# Patient Record
Sex: Male | Born: 1980 | Race: Black or African American | Hispanic: No | Marital: Married | Smoking: Former smoker
Health system: Southern US, Community
[De-identification: ages and names within clinical notes are randomized; demographics above are authoritative.]

## PROBLEM LIST (undated history)

## (undated) DIAGNOSIS — J45909 Unspecified asthma, uncomplicated: Secondary | ICD-10-CM

## (undated) DIAGNOSIS — G4733 Obstructive sleep apnea (adult) (pediatric): Secondary | ICD-10-CM

## (undated) DIAGNOSIS — I10 Essential (primary) hypertension: Secondary | ICD-10-CM

## (undated) DIAGNOSIS — R011 Cardiac murmur, unspecified: Secondary | ICD-10-CM

## (undated) DIAGNOSIS — E785 Hyperlipidemia, unspecified: Secondary | ICD-10-CM

## (undated) DIAGNOSIS — K219 Gastro-esophageal reflux disease without esophagitis: Secondary | ICD-10-CM

## (undated) DIAGNOSIS — Z9989 Dependence on other enabling machines and devices: Secondary | ICD-10-CM

## (undated) DIAGNOSIS — M1990 Unspecified osteoarthritis, unspecified site: Secondary | ICD-10-CM

## (undated) HISTORY — DX: Cardiac murmur, unspecified: R01.1

## (undated) HISTORY — DX: Unspecified osteoarthritis, unspecified site: M19.90

## (undated) HISTORY — PX: ANTERIOR CRUCIATE LIGAMENT REPAIR: SHX115

## (undated) HISTORY — DX: Gastro-esophageal reflux disease without esophagitis: K21.9

## (undated) HISTORY — DX: Hyperlipidemia, unspecified: E78.5

---

## 2006-04-10 ENCOUNTER — Encounter: Admission: RE | Admit: 2006-04-10 | Discharge: 2006-04-10 | Payer: Self-pay | Admitting: Orthopedic Surgery

## 2012-04-12 ENCOUNTER — Encounter (HOSPITAL_BASED_OUTPATIENT_CLINIC_OR_DEPARTMENT_OTHER): Payer: Self-pay | Admitting: *Deleted

## 2012-04-12 ENCOUNTER — Emergency Department (HOSPITAL_BASED_OUTPATIENT_CLINIC_OR_DEPARTMENT_OTHER)
Admission: EM | Admit: 2012-04-12 | Discharge: 2012-04-12 | Disposition: A | Discharge status: Home / Self Care | Payer: Self-pay | Attending: Emergency Medicine | Admitting: Emergency Medicine

## 2012-04-12 DIAGNOSIS — F17200 Nicotine dependence, unspecified, uncomplicated: Secondary | ICD-10-CM | POA: Insufficient documentation

## 2012-04-12 DIAGNOSIS — L02415 Cutaneous abscess of right lower limb: Secondary | ICD-10-CM

## 2012-04-12 DIAGNOSIS — R234 Changes in skin texture: Secondary | ICD-10-CM | POA: Insufficient documentation

## 2012-04-12 DIAGNOSIS — J45909 Unspecified asthma, uncomplicated: Secondary | ICD-10-CM | POA: Insufficient documentation

## 2012-04-12 HISTORY — DX: Unspecified asthma, uncomplicated: J45.909

## 2012-04-12 MED ORDER — DOXYCYCLINE HYCLATE 100 MG PO CAPS: 100.0000 mg | ORAL_CAPSULE | Freq: Two times a day (BID) | ORAL | Status: AC

## 2012-04-12 NOTE — Discharge Instructions (Signed)

## 2012-04-12 NOTE — ED Notes (Signed)
Pt c/o "bite mark" to right hip area. Swelling/redness with scabbed inner area. Pt denies fevers.

## 2012-04-12 NOTE — ED Provider Notes (Signed)
History     CSN: [id number]  Arrival date & time 04/12/12  2230   First MD Initiated Contact with Patient 04/12/12 2259      Chief Complaint  Patient presents with  . Insect Bite    (Consider location/radiation/quality/duration/timing/severity/associated sxs/prior treatment) Patient is a 31 y.o. male presenting with rash. The history is provided by the patient. No language interpreter was used.  Rash  This is a new problem. The current episode started yesterday. The problem has been gradually worsening. The problem is associated with nothing. The rash is present on the right lower leg. The pain is at a severity of 3/10. The pain is mild. The pain has been constant since onset. Associated symptoms include blisters, itching and pain. He has tried nothing for the symptoms. The treatment provided no relief.  Pt complains of pain to right thigh. Pt thinks he may have been bitten by something  Past Medical History  Diagnosis Date  . Asthma     Past Surgical History  Procedure Date  . Anterior cruciate ligament repair     No family history on file.  History  Substance Use Topics  . Smoking status: Current Everyday Smoker  . Smokeless tobacco: Not on file  . Alcohol Use: Yes      Review of Systems  Skin: Positive for itching and rash.  All other systems reviewed and are negative.    Allergies  Peanuts  Home Medications   Current Outpatient Rx  Name Route Sig Dispense Refill  . ALBUTEROL SULFATE HFA 108 (90 BASE) MCG/ACT IN AERS Inhalation Inhale 2 puffs into the lungs every 6 (six) hours as needed. Patient used this medication for wheezing and shortness of breath.    . EXCEDRIN PO Oral Take 2 tablets by mouth daily as needed. Patient used this medication for his headache.    Marland Kitchen HYDROCORTISONE 1 % EX CREA Topical Apply 1 application topically 2 (two) times daily. Patient used this medication for a bug bite.      BP 149/94  Pulse 82  Temp 98.5 F (36.9 C) (Oral)   Resp 16  Ht 5\' 11"  (1.803 m)  Wt 290 lb (131.543 kg)  BMI 40.45 kg/m2  SpO2 100%  Physical Exam  Vitals reviewed. Constitutional: He is oriented to person, place, and time. He appears well-developed and well-nourished.  Abdominal: Soft.  Musculoskeletal: Normal range of motion.  Neurological: He is alert and oriented to person, place, and time. He has normal reflexes.  Skin: There is erythema.       Swollen right thigh,  Scab center,  Drainage from area  Psychiatric: He has a normal mood and affect.    ED Course  Procedures (including critical care time)  Labs Reviewed - No data to display No results found.   No diagnosis found.    MDM  I unroofed scab and pt had purulent drainage,  Culture obtained.   Pt given rx for doxycycline        Lonia Skinner [place], [place] 04/12/12 2340

## 2012-04-13 NOTE — ED Provider Notes (Signed)
Medical screening examination/treatment/procedure(s) were performed by non-physician practitioner and as supervising physician I was immediately available for consultation/collaboration.  Jasmine Awe, MD 04/13/12 [phone]

## 2012-04-15 LAB — WOUND CULTURE: Special Requests: NORMAL

## 2012-04-15 NOTE — ED Notes (Signed)
Voice mail message left for patient to return call. 

## 2012-04-15 NOTE — ED Notes (Signed)
+   [facility] called from [facility] lab to [facility] .Patient treated with  Doxycycline-sensitive to same-chart appended per protocol MD.

## 2012-04-19 NOTE — ED Notes (Signed)
Attempted to contact patient. No answer. Left voicemail for patient to call back. °

## 2012-04-23 NOTE — ED Notes (Signed)
Several attempts made to contact via phone. Letter sent to [facility] address.

## 2012-05-09 ENCOUNTER — Emergency Department (HOSPITAL_BASED_OUTPATIENT_CLINIC_OR_DEPARTMENT_OTHER)
Admission: EM | Admit: 2012-05-09 | Discharge: 2012-05-09 | Disposition: A | Discharge status: Home / Self Care | Payer: Self-pay | Attending: Emergency Medicine | Admitting: Emergency Medicine

## 2012-05-09 ENCOUNTER — Encounter (HOSPITAL_BASED_OUTPATIENT_CLINIC_OR_DEPARTMENT_OTHER): Payer: Self-pay | Admitting: *Deleted

## 2012-05-09 DIAGNOSIS — J45901 Unspecified asthma with (acute) exacerbation: Secondary | ICD-10-CM | POA: Insufficient documentation

## 2012-05-09 MED ORDER — ALBUTEROL SULFATE (5 MG/ML) 0.5% IN NEBU
5.0000 mg | INHALATION_SOLUTION | Freq: Once | RESPIRATORY_TRACT | Status: AC
  Administered 2012-05-09: 5 mg via RESPIRATORY_TRACT

## 2012-05-09 MED ORDER — ALBUTEROL SULFATE HFA 108 (90 BASE) MCG/ACT IN AERS: 2.0000 | INHALATION_SPRAY | RESPIRATORY_TRACT | Status: DC | PRN

## 2012-05-09 MED ORDER — ALBUTEROL SULFATE (5 MG/ML) 0.5% IN NEBU
INHALATION_SOLUTION | RESPIRATORY_TRACT | Status: AC
  Administered 2012-05-09: 5 mg via RESPIRATORY_TRACT
  Filled 2012-05-09: qty 1

## 2012-05-09 MED ORDER — PREDNISONE 50 MG PO TABS
60.0000 mg | ORAL_TABLET | Freq: Once | ORAL | Status: AC
  Administered 2012-05-09: 60 mg via ORAL
  Filled 2012-05-09: qty 1

## 2012-05-09 MED ORDER — PREDNISONE 20 MG PO TABS: 60.0000 mg | ORAL_TABLET | Freq: Every day | ORAL | Status: AC

## 2012-05-09 MED ORDER — ALBUTEROL SULFATE (5 MG/ML) 0.5% IN NEBU
INHALATION_SOLUTION | RESPIRATORY_TRACT | Status: AC
  Filled 2012-05-09: qty 1

## 2012-05-09 MED ORDER — ALBUTEROL SULFATE HFA 108 (90 BASE) MCG/ACT IN AERS
INHALATION_SPRAY | RESPIRATORY_TRACT | Status: AC
  Filled 2012-05-09: qty 6.7

## 2012-05-09 NOTE — ED Notes (Signed)
Patient reports sob of breath that has grown worse for the past two days, c/o chest tightness. Has used an inhaler in the past

## 2012-05-09 NOTE — ED Notes (Signed)
Nebulizer was given with just Albuterol. Patient has a severe peanut allergy which causes severe shortness of breath and facial swelling per patient so Atrovent was contraindicated. Patient is tolerating treatment well and Rt will continue to monitor.

## 2012-05-09 NOTE — ED Provider Notes (Signed)
History     CSN: [id number]  Arrival date & time 05/09/12  0747   First MD Initiated Contact with Patient 05/09/12 [phone]      Chief Complaint  Patient presents with  . Asthma    (Consider location/radiation/quality/duration/timing/severity/associated sxs/prior treatment) HPI Patient is a 31 year old male with past medical history significant for asthma who presents today complaining of 2 days of increasing chest tightness and shortness of breath. He denies frank chest pain. He's had no fevers or cough. Patient does not have an albuterol inhaler at home. He last used an inhaler about 3 months ago. Patient cannot recall the last time he had to use prednisone but thinks it may have been as much as 3 years ago. Patient denies sick contacts or any new possible allergic exposures. This feels consistent with prior episodes of asthma exacerbation and he rates his discomfort as a 5/10. There are no other associated or modifying factors. Past Medical History  Diagnosis Date  . Asthma     Past Surgical History  Procedure Date  . Anterior cruciate ligament repair     History reviewed. No pertinent family history.  History  Substance Use Topics  . Smoking status: Current Everyday Smoker  . Smokeless tobacco: Not on file  . Alcohol Use: Yes      Review of Systems  Constitutional: Negative.   HENT: Negative.   Eyes: Negative.   Respiratory: Positive for chest tightness and shortness of breath.   Cardiovascular: Negative.   Gastrointestinal: Negative.   Genitourinary: Negative.   Musculoskeletal: Negative.   Skin: Negative.   Neurological: Negative.   Hematological: Negative.   Psychiatric/Behavioral: Negative.   All other systems reviewed and are negative.    Allergies  Peanuts  Home Medications   Current Outpatient Rx  Name Route Sig Dispense Refill  . ALBUTEROL SULFATE HFA 108 (90 BASE) MCG/ACT IN AERS Inhalation Inhale 2 puffs into the lungs every 6 (six) hours as  needed. Patient used this medication for wheezing and shortness of breath.    . EXCEDRIN PO Oral Take 2 tablets by mouth daily as needed. Patient used this medication for his headache.    Marland Kitchen HYDROCORTISONE 1 % EX CREA Topical Apply 1 application topically 2 (two) times daily. Patient used this medication for a bug bite.      BP 150/94  Pulse 54  Temp 97.7 F (36.5 C) (Oral)  Resp 19  SpO2 95%  Physical Exam  Nursing note and vitals reviewed. GEN: Well-developed, well-nourished male in no distress HEENT: Atraumatic, normocephalic. Oropharynx clear without erythema EYES: PERRLA BL, no scleral icterus. NECK: Trachea midline, no meningismus CV: regular rate and rhythm. No murmurs, rubs, or gallops PULM: Diminished throughout GI: soft, non-tender. No guarding, rebound, or tenderness. + bowel sounds  GU: deferred Neuro: cranial nerves grossly 2-12 intact, no abnormalities of strength or sensation, A and O x 3 MSK: Patient moves all 4 extremities symmetrically, no deformity, edema, or injury noted Skin: No rashes petechiae, purpura, or jaundice Psych: no abnormality of mood   ED Course  Procedures (including critical care time)  Labs Reviewed - No data to display No results found.   1. Asthma exacerbation       MDM  Patient was evaluated by myself. Based on evaluation patient was felt to have an asthma exacerbation. He was having no chest pain to suggest musculoskeletal injury or ACS. Patient experienced chest tightness and diminished breath sounds consistent with prior asthma exacerbations and classic presentation of  same. He was hemodynamically stable. Patient had not had a bronchodilator prior to presentation and therefore immediate steroid administration was not felt to be necessary. Patient is provided with a breathing treatment. He did not have any cough, fever, or hemodynamically instability to suggest need for chest x-ray or possibility of pneumonia today. Patient will be  reassessed following breathing treatment.  8:29 AM On reassessment patient was still diminished though slightly improved. Patient was given a dose of prednisone 60 mg and a second breathing treatment was ordered.  8:49 AM Patient feeling much better. On reassessment improved air movement appreciated. Patient discharged in good condition.        Cyndra Numbers, MD 05/09/12 [phone]

## 2012-09-30 ENCOUNTER — Emergency Department (HOSPITAL_BASED_OUTPATIENT_CLINIC_OR_DEPARTMENT_OTHER)
Admission: EM | Admit: 2012-09-30 | Discharge: 2012-09-30 | Disposition: A | Discharge status: Home / Self Care | Payer: Self-pay | Attending: Emergency Medicine | Admitting: Emergency Medicine

## 2012-09-30 ENCOUNTER — Encounter (HOSPITAL_BASED_OUTPATIENT_CLINIC_OR_DEPARTMENT_OTHER): Payer: Self-pay | Admitting: *Deleted

## 2012-09-30 DIAGNOSIS — J45909 Unspecified asthma, uncomplicated: Secondary | ICD-10-CM | POA: Insufficient documentation

## 2012-09-30 DIAGNOSIS — F17200 Nicotine dependence, unspecified, uncomplicated: Secondary | ICD-10-CM | POA: Insufficient documentation

## 2012-09-30 DIAGNOSIS — R110 Nausea: Secondary | ICD-10-CM | POA: Insufficient documentation

## 2012-09-30 DIAGNOSIS — Z79899 Other long term (current) drug therapy: Secondary | ICD-10-CM | POA: Insufficient documentation

## 2012-09-30 DIAGNOSIS — I10 Essential (primary) hypertension: Secondary | ICD-10-CM | POA: Insufficient documentation

## 2012-09-30 LAB — BASIC METABOLIC PANEL
BUN: 12 mg/dL (ref 6–23)
CO2: 24 mEq/L (ref 19–32)
Calcium: 9.3 mg/dL (ref 8.4–10.5)
GFR calc non Af Amer: 88 mL/min — ABNORMAL LOW (ref >90)
Potassium: 3.8 mEq/L (ref 3.5–5.1)
Sodium: 139 mEq/L (ref 135–145)

## 2012-09-30 LAB — CBC
Hemoglobin: 15 g/dL (ref 13.0–17.0)
MCV: 82.3 fL (ref 78.0–100.0)
RBC: 5.37 MIL/uL (ref 4.22–5.81)
WBC: 7.9 10*3/uL (ref 4.0–10.5)

## 2012-09-30 MED ORDER — HYDROCHLOROTHIAZIDE 25 MG PO TABS: 25.0000 mg | ORAL_TABLET | Freq: Every day | ORAL | Status: DC

## 2012-09-30 NOTE — ED Provider Notes (Signed)
History     CSN: [id number]  Arrival date & time 09/30/12  1423   First MD Initiated Contact with Patient 09/30/12 1436      Chief Complaint  Patient presents with  . Headache    (Consider location/radiation/quality/duration/timing/severity/associated sxs/prior treatment) HPI Comments: Pt states that he has been having [place] and tingling all over his body:pt states that he has had his bp checked at pharmacy and it has been 150  Patient is a 31 y.o. male presenting with headaches. The history is provided by the patient. No language interpreter was used.  Headache  This is a new problem. The current episode started more than 1 week ago. The problem occurs constantly. The problem has not changed since onset.The headache is associated with nothing. The pain is located in the frontal region. The quality of the pain is described as throbbing. The pain is severe. The pain does not radiate.    Past Medical History  Diagnosis Date  . Asthma     Past Surgical History  Procedure Date  . Anterior cruciate ligament repair     No family history on file.  History  Substance Use Topics  . Smoking status: Current Every Day Smoker  . Smokeless tobacco: Not on file  . Alcohol Use: Yes      Review of Systems  Eyes: Negative for visual disturbance.  Neurological: Positive for headaches.    Allergies  Peanuts  Home Medications   Current Outpatient Rx  Name  Route  Sig  Dispense  Refill  . ALBUTEROL SULFATE HFA 108 (90 BASE) MCG/ACT IN AERS   Inhalation   Inhale 2 puffs into the lungs every 6 (six) hours as needed. Patient used this medication for wheezing and shortness of breath.         . EXCEDRIN PO   Oral   Take 2 tablets by mouth daily as needed. Patient used this medication for his headache.         Marland Kitchen HYDROCORTISONE 1 % EX CREA   Topical   Apply 1 application topically 2 (two) times daily. Patient used this medication for a bug bite.           BP 162/83   Pulse 74  Temp 98 F (36.7 C) (Oral)  Resp 20  SpO2 99%  Physical Exam  Nursing note and vitals reviewed. Constitutional: He is oriented to person, place, and time. He appears well-developed and well-nourished.  HENT:  Head: Normocephalic and atraumatic.  Cardiovascular: Normal rate and regular rhythm.   Pulmonary/Chest: Effort normal and breath sounds normal.  Abdominal: Bowel sounds are normal.  Musculoskeletal: Normal range of motion.  Neurological: He is alert and oriented to person, place, and time. Coordination normal.  Skin: Skin is warm and dry.    ED Course  Procedures (including critical care time)  Labs Reviewed  BASIC METABOLIC PANEL - Abnormal; Notable for the following:    Glucose, Bld 101 (*)     GFR calc non Af Amer 88 (*)     All other components within normal limits  CBC   No results found.   1. HTN (hypertension)       MDM  Discussed with pt follow up that is necessary and placed him on hctz:pt advised on side effects of bp medication       Teressa Lower, NP 09/30/12 [phone]

## 2012-09-30 NOTE — ED Notes (Signed)
Headache nausea and tingling in his left hand x 2 weeks. No hx of htn but he has been taking his BP at [facility] and it has been high.

## 2012-10-01 NOTE — ED Provider Notes (Signed)
Medical screening examination/treatment/procedure(s) were performed by non-physician practitioner and as supervising physician I was immediately available for consultation/collaboration.  Derwood Kaplan, MD 10/01/12 1946

## 2013-03-19 DIAGNOSIS — I10 Essential (primary) hypertension: Secondary | ICD-10-CM | POA: Diagnosis present

## 2013-05-27 ENCOUNTER — Emergency Department (HOSPITAL_BASED_OUTPATIENT_CLINIC_OR_DEPARTMENT_OTHER)
Admission: EM | Admit: 2013-05-27 | Discharge: 2013-05-27 | Disposition: A | Discharge status: Home / Self Care | Payer: Self-pay | Attending: Emergency Medicine | Admitting: Emergency Medicine

## 2013-05-27 ENCOUNTER — Encounter (HOSPITAL_BASED_OUTPATIENT_CLINIC_OR_DEPARTMENT_OTHER): Payer: Self-pay

## 2013-05-27 DIAGNOSIS — J45909 Unspecified asthma, uncomplicated: Secondary | ICD-10-CM | POA: Insufficient documentation

## 2013-05-27 DIAGNOSIS — N342 Other urethritis: Secondary | ICD-10-CM | POA: Insufficient documentation

## 2013-05-27 DIAGNOSIS — F17200 Nicotine dependence, unspecified, uncomplicated: Secondary | ICD-10-CM | POA: Insufficient documentation

## 2013-05-27 DIAGNOSIS — I10 Essential (primary) hypertension: Secondary | ICD-10-CM | POA: Insufficient documentation

## 2013-05-27 HISTORY — DX: Essential (primary) hypertension: I10

## 2013-05-27 LAB — URINALYSIS, ROUTINE W REFLEX MICROSCOPIC: Specific Gravity, Urine: 1.018 (ref 1.005–1.030)

## 2013-05-27 MED ORDER — CEFTRIAXONE SODIUM 250 MG IJ SOLR
250.0000 mg | Freq: Once | INTRAMUSCULAR | Status: AC
  Administered 2013-05-27: 250 mg via INTRAMUSCULAR
  Filled 2013-05-27: qty 250

## 2013-05-27 MED ORDER — AZITHROMYCIN 250 MG PO TABS
1000.0000 mg | ORAL_TABLET | Freq: Once | ORAL | Status: AC
  Administered 2013-05-27: 1000 mg via ORAL
  Filled 2013-05-27: qty 4

## 2013-05-27 NOTE — ED Notes (Signed)
Patient called for room 10. He stated he needed to run to his car for his phone. Will call again for room 10

## 2013-05-27 NOTE — ED Notes (Signed)
Patient here with penile burning x 3 days that he describes as constant. Denies dysuria, denies discharge, denies injury

## 2013-05-27 NOTE — ED Notes (Signed)
Encouraged Pt. To sit out front and wait on discharge papers due to making sure no allergic reaction to injection.

## 2013-05-27 NOTE — ED Provider Notes (Signed)
CSN: [id number]     Arrival date & time 05/27/13  1855 History     First MD Initiated Contact with Patient 05/27/13 2028     Chief Complaint  Patient presents with  . Penis Pain   (Consider location/radiation/quality/duration/timing/severity/associated sxs/prior Treatment) HPI Comments: Patient presents with a burning pain to the tip of his penis. He states it's worse with urination. He denies any discharge from the penis. He denies any sores or rashes. He denies any testicular or scrotal pain. He denies any abdominal pain nausea vomiting or fevers. He is sexually active and does say that he's had an STD in the past but it's been a while. He states that the pain has been going on for about 3 days and is been fairly constant.   Past Medical History  Diagnosis Date  . Asthma   . Hypertension    Past Surgical History  Procedure Laterality Date  . Anterior cruciate ligament repair     No family history on file. History  Substance Use Topics  . Smoking status: Current Every Day Smoker -- 0.50 packs/day    Types: Cigarettes  . Smokeless tobacco: Not on file  . Alcohol Use: Yes    Review of Systems  Constitutional: Negative for fever, chills, diaphoresis and fatigue.  HENT: Negative for congestion, rhinorrhea and sneezing.   Eyes: Negative.   Respiratory: Negative for cough, chest tightness and shortness of breath.   Cardiovascular: Negative for chest pain and leg swelling.  Gastrointestinal: Negative for nausea, vomiting, abdominal pain, diarrhea and blood in stool.  Genitourinary: Positive for penile pain. Negative for urgency, frequency, hematuria, flank pain, discharge, penile swelling, scrotal swelling, difficulty urinating and testicular pain.  Musculoskeletal: Negative for back pain and arthralgias.  Skin: Negative for rash.  Neurological: Negative for dizziness, speech difficulty, weakness, numbness and headaches.    Allergies  Peanuts  Home Medications   Current  Outpatient Rx  Name  Route  Sig  Dispense  Refill  . albuterol (PROVENTIL HFA;VENTOLIN HFA) 108 (90 BASE) MCG/ACT inhaler   Inhalation   Inhale 2 puffs into the lungs every 6 (six) hours as needed. Patient used this medication for wheezing and shortness of breath.         . hydrochlorothiazide (HYDRODIURIL) 25 MG tablet   Oral   Take 1 tablet (25 mg total) by mouth daily.   30 tablet   0    BP 158/76  Pulse 61  Temp(Src) 99.1 F (37.3 C) (Oral)  Resp 20  SpO2 98% Physical Exam  Constitutional: He is oriented to person, place, and time. He appears well-developed and well-nourished.  HENT:  Head: Normocephalic and atraumatic.  Eyes: Pupils are equal, round, and reactive to light.  Neck: Normal range of motion. Neck supple.  Cardiovascular: Normal rate, regular rhythm and normal heart sounds.   Pulmonary/Chest: Effort normal and breath sounds normal. No respiratory distress. He has no wheezes. He has no rales. He exhibits no tenderness.  Abdominal: Soft. Bowel sounds are normal. There is no tenderness. There is no rebound and no guarding.  Genitourinary: Testes normal and penis normal. Right testis shows no mass, no swelling and no tenderness. Left testis shows no mass, no swelling and no tenderness. Circumcised. No penile erythema or penile tenderness. No discharge found.  There's no swelling, rash or discharge noted from the penis. There is no pain to the testicles. There's no swelling to the scrotal area noted.  Musculoskeletal: Normal range of motion. He exhibits no  edema.  Lymphadenopathy:    He has no cervical adenopathy.  Neurological: He is alert and oriented to person, place, and time.  Skin: Skin is warm and dry. No rash noted.  Psychiatric: He has a normal mood and affect.    ED Course   Procedures (including critical care time)  Labs Reviewed  GC/CHLAMYDIA PROBE AMP  URINALYSIS, ROUTINE W REFLEX MICROSCOPIC   No results found. 1. Urethritis     MDM  GC  and Chlamydia probe was done. Patient has no evidence of UTI. He was treated presumptively with Rocephin and Zithromax. He was given a referral to followup with urology if his symptoms continue.  Rolan Bucco, MD 05/27/13 2051

## 2014-06-26 ENCOUNTER — Emergency Department (HOSPITAL_BASED_OUTPATIENT_CLINIC_OR_DEPARTMENT_OTHER)
Admission: EM | Admit: 2014-06-26 | Discharge: 2014-06-27 | Disposition: A | Discharge status: Home / Self Care | Payer: Self-pay | Attending: Emergency Medicine | Admitting: Emergency Medicine

## 2014-06-26 ENCOUNTER — Encounter (HOSPITAL_BASED_OUTPATIENT_CLINIC_OR_DEPARTMENT_OTHER): Payer: Self-pay | Admitting: Emergency Medicine

## 2014-06-26 DIAGNOSIS — J45909 Unspecified asthma, uncomplicated: Secondary | ICD-10-CM | POA: Insufficient documentation

## 2014-06-26 DIAGNOSIS — Z79899 Other long term (current) drug therapy: Secondary | ICD-10-CM | POA: Insufficient documentation

## 2014-06-26 DIAGNOSIS — IMO0002 Reserved for concepts with insufficient information to code with codable children: Secondary | ICD-10-CM | POA: Insufficient documentation

## 2014-06-26 DIAGNOSIS — J45901 Unspecified asthma with (acute) exacerbation: Secondary | ICD-10-CM | POA: Insufficient documentation

## 2014-06-26 DIAGNOSIS — I10 Essential (primary) hypertension: Secondary | ICD-10-CM | POA: Insufficient documentation

## 2014-06-26 DIAGNOSIS — F17200 Nicotine dependence, unspecified, uncomplicated: Secondary | ICD-10-CM | POA: Insufficient documentation

## 2014-06-26 MED ORDER — IPRATROPIUM-ALBUTEROL 0.5-2.5 (3) MG/3ML IN SOLN
3.0000 mL | RESPIRATORY_TRACT | Status: DC
  Administered 2014-06-26: 3 mL via RESPIRATORY_TRACT

## 2014-06-26 MED ORDER — ALBUTEROL SULFATE (2.5 MG/3ML) 0.083% IN NEBU: 5.0000 mg | INHALATION_SOLUTION | Freq: Once | RESPIRATORY_TRACT | Status: AC

## 2014-06-26 MED ORDER — ALBUTEROL (5 MG/ML) CONTINUOUS INHALATION SOLN
15.0000 mg/h | INHALATION_SOLUTION | RESPIRATORY_TRACT | Status: DC
  Administered 2014-06-26: 15 mg/h via RESPIRATORY_TRACT
  Filled 2014-06-26: qty 20

## 2014-06-26 MED ORDER — PREDNISONE 50 MG PO TABS
60.0000 mg | ORAL_TABLET | Freq: Once | ORAL | Status: AC
  Administered 2014-06-26: 60 mg via ORAL
  Filled 2014-06-26 (×2): qty 1

## 2014-06-26 MED ORDER — ALBUTEROL SULFATE (2.5 MG/3ML) 0.083% IN NEBU
5.0000 mg | INHALATION_SOLUTION | Freq: Once | RESPIRATORY_TRACT | Status: AC
  Administered 2014-06-26: 5 mg via RESPIRATORY_TRACT
  Filled 2014-06-26: qty 6

## 2014-06-26 MED ORDER — ALBUTEROL SULFATE (2.5 MG/3ML) 0.083% IN NEBU
5.0000 mg | INHALATION_SOLUTION | Freq: Once | RESPIRATORY_TRACT | Status: DC
  Filled 2014-06-26: qty 6

## 2014-06-26 MED ORDER — IPRATROPIUM BROMIDE 0.02 % IN SOLN
0.5000 mg | Freq: Once | RESPIRATORY_TRACT | Status: AC
  Administered 2014-06-26: 0.5 mg via RESPIRATORY_TRACT
  Filled 2014-06-26: qty 2.5

## 2014-06-26 MED ORDER — ALBUTEROL SULFATE (2.5 MG/3ML) 0.083% IN NEBU
2.5000 mg | INHALATION_SOLUTION | Freq: Once | RESPIRATORY_TRACT | Status: AC
  Administered 2014-06-26: 2.5 mg via RESPIRATORY_TRACT

## 2014-06-26 MED ORDER — IPRATROPIUM BROMIDE 0.02 % IN SOLN
0.5000 mg | Freq: Once | RESPIRATORY_TRACT | Status: DC
  Filled 2014-06-26: qty 2.5

## 2014-06-26 NOTE — ED Notes (Signed)
Asthma. Chest congestion x 4 days. No relief with inhaler. He is speaking in complete sentences.

## 2014-06-26 NOTE — ED Provider Notes (Signed)
CSN: [id number]     Arrival date & time 06/26/14  1832 History  This chart was scribed for Ethelda Chick, MD, by Yevette Edwards, ED Scribe. This patient was seen in room MH01/MH01 and the patient's care was started at 8:41 PM.   First MD Initiated Contact with Patient 06/26/14 2016     Chief Complaint  Patient presents with  . Asthma    Patient is a 33 y.o. male presenting with asthma. The history is provided by the patient. No language interpreter was used.  Asthma This is a chronic problem. The current episode started 2 days ago. The problem occurs constantly. The problem has been gradually worsening. Associated symptoms include headaches and shortness of breath. Nothing aggravates the symptoms. The symptoms are relieved by medications. The treatment provided mild relief.   HPI Comments: Ace Bergfeld is a 33 y.o. male, with a h/o asthma and HTN, who presents to the Emergency Department complaining of asthmatic symptoms which have gradually worsened over the course of two days and which have been exacerbated by four days of chest congestion, sore throat, rhinorrhea, and a headache. He has treated his symptoms with albuterol with temporary relief; he states he has used an inhaler approximately thirty times today. He endorses household sick contacts. He reports a h/o similar symptoms with seasonal changes. Mr. Cerullo states he had a course of steroids two months ago; he reports hospital admission due to asthma several years ago; he denies a ventilator. Mr. Marchuk is a current smoker- quit smoking recently.   Past Medical History  Diagnosis Date  . Asthma   . Hypertension    Past Surgical History  Procedure Laterality Date  . Anterior cruciate ligament repair     No family history on file. History  Substance Use Topics  . Smoking status: Current Every Day Smoker -- 0.50 packs/day    Types: Cigarettes  . Smokeless tobacco: Not on file  . Alcohol Use: Yes    Review of Systems   HENT: Positive for congestion, rhinorrhea and sore throat.   Respiratory: Positive for shortness of breath.   Neurological: Positive for headaches.  All other systems reviewed and are negative.   Allergies  Peanuts  Home Medications   Prior to Admission medications   Medication Sig Start Date End Date Taking? Authorizing Provider  LISINOPRIL PO Take by mouth.   Yes Historical Provider, MD  albuterol (PROVENTIL HFA;VENTOLIN HFA) 108 (90 BASE) MCG/ACT inhaler Inhale 2 puffs into the lungs every 6 (six) hours as needed. Patient used this medication for wheezing and shortness of breath.    Historical Provider, MD  albuterol (PROVENTIL) (2.5 MG/3ML) 0.083% nebulizer solution Take 3 mLs (2.5 mg total) by nebulization every 6 (six) hours as needed for wheezing or shortness of breath. 06/27/14   Ethelda Chick, MD  hydrochlorothiazide (HYDRODIURIL) 25 MG tablet Take 1 tablet (25 mg total) by mouth daily. 09/30/12   Teressa Lower, NP  predniSONE (DELTASONE) 50 MG tablet Take 1 tablet (50 mg total) by mouth daily. 06/27/14   Ethelda Chick, MD   Triage Vitals: BP 170/93  Pulse 99  Temp(Src) 99.7 F (37.6 C) (Oral)  Resp 18  Ht  (1.803 m)  Wt 280 lb (127.007 kg)  BMI 39.07 kg/m2  SpO2 96% Vitals reviewed Physical Exam Physical Examination: General appearance - alert, well appearing, and in no distress Mental status - alert, oriented to person, place, and time Eyes - no conjunctival injection, no scleral icterus Mouth -  mucous membranes moist, pharynx normal without lesions Neck - supple, no significant adenopathy Chest - bilateral expiratory wheezing with decreased air movement, BSS, no increased respiratory effort Heart - normal rate, regular rhythm, normal S1, S2, no murmurs, rubs, clicks or gallops Abdomen - soft, nontender, nondistended, no masses or organomegaly Extremities - peripheral pulses normal, no pedal edema, no clubbing or cyanosis Skin - normal coloration and  turgor, no rashes  ED Course  Procedures (including critical care time)  DIAGNOSTIC STUDIES: Oxygen Saturation is 96% on room air, normal by my interpretation.    COORDINATION OF CARE:  8:45 PM- Discussed treatment plan with patient, and the patient agreed to the plan. The plan includes a nebulizer and steroid course.   11:56 PM pt has had 2 albuterol/atrovent nebs and one continuous. He is feeling much improved.  His wheezing is cleared.  I have discussed with after giving him so much albuterol I recommend admission to space out nebs and be sure he will tolerate this.  He states he would prefer to be discharged to home and that he understands the risk of him getting worse again off the treatments.  He received steroids in the ED.  We will watch him off neb here in the ED for awhile to be sure he is still doing well and then will proceed with discharge as he is not in agreement with admission to the hospital.    Labs Review Labs Reviewed - No data to display  Imaging Review No results found.   EKG Interpretation None      MDM   Final diagnoses:  Asthma exacerbation    Pt presenting with c/o wheezing, hx of asthma.  Pt has had 2 albuterol/atrovent nebs, placed on continous.  Now is feeling much better.  Given steroids.  Pt does not want to be admitted.  I discussed with him that this would be preferred- see note above, but he feels that he would rather continue his treatment at home.  Given rx for albuterol and prednisone.  Warned of the risks of worsening condition even death if wheezing becomes worse.  He agrees that he will come back to the ED if he feels worse.  Discharged with strict return precautions.  Pt agreeable with plan.  I personally performed the services described in this documentation, which was scribed in my presence. The recorded information has been reviewed and is accurate.  Nursing notes including past medical history and social history reviewed and considered  in documentation Prior records reviewed and considered during this visit    Ethelda Chick, MD 06/27/14 1901

## 2014-06-27 MED ORDER — PREDNISONE 50 MG PO TABS: 50.0000 mg | ORAL_TABLET | Freq: Every day | ORAL | Status: DC

## 2014-06-27 MED ORDER — ALBUTEROL SULFATE (2.5 MG/3ML) 0.083% IN NEBU: 2.5000 mg | INHALATION_SOLUTION | Freq: Four times a day (QID) | RESPIRATORY_TRACT | Status: DC | PRN

## 2014-06-27 NOTE — Discharge Instructions (Signed)
Return to the ED with any concerns including difficulty breathing despite using albuterol every 4 hours, not drinking fluids, decreased urine output, vomiting and not able to keep down liquids or medications, decreased level of alertness/lethargy, or any other alarming symptoms °

## 2015-02-25 ENCOUNTER — Emergency Department (HOSPITAL_BASED_OUTPATIENT_CLINIC_OR_DEPARTMENT_OTHER): Payer: Self-pay

## 2015-02-25 ENCOUNTER — Encounter (HOSPITAL_BASED_OUTPATIENT_CLINIC_OR_DEPARTMENT_OTHER): Payer: Self-pay | Admitting: *Deleted

## 2015-02-25 ENCOUNTER — Emergency Department (HOSPITAL_BASED_OUTPATIENT_CLINIC_OR_DEPARTMENT_OTHER)
Admission: EM | Admit: 2015-02-25 | Discharge: 2015-02-25 | Disposition: A | Discharge status: Home / Self Care | Payer: Self-pay | Attending: Emergency Medicine | Admitting: Emergency Medicine

## 2015-02-25 DIAGNOSIS — Z720 Tobacco use: Secondary | ICD-10-CM | POA: Insufficient documentation

## 2015-02-25 DIAGNOSIS — Z79899 Other long term (current) drug therapy: Secondary | ICD-10-CM | POA: Insufficient documentation

## 2015-02-25 DIAGNOSIS — J45901 Unspecified asthma with (acute) exacerbation: Secondary | ICD-10-CM | POA: Insufficient documentation

## 2015-02-25 DIAGNOSIS — I10 Essential (primary) hypertension: Secondary | ICD-10-CM | POA: Insufficient documentation

## 2015-02-25 LAB — CBC WITH DIFFERENTIAL/PLATELET
BASOS ABS: 0.1 10*3/uL (ref 0.0–0.1)
BASOS PCT: 1 % (ref 0–1)
EOS PCT: 5 % (ref 0–5)
Eosinophils Absolute: 0.5 10*3/uL (ref 0.0–0.7)
HCT: 43.2 % (ref 39.0–52.0)
HEMOGLOBIN: 14.5 g/dL (ref 13.0–17.0)
Lymphocytes Relative: 28 % (ref 12–46)
Lymphs Abs: 2.7 10*3/uL (ref 0.7–4.0)
MCH: 27.7 pg (ref 26.0–34.0)
MCHC: 33.6 g/dL (ref 30.0–36.0)
MCV: 82.6 fL (ref 78.0–100.0)
MONOS PCT: 11 % (ref 3–12)
Monocytes Absolute: 1 10*3/uL (ref 0.1–1.0)
Neutro Abs: 5.3 10*3/uL (ref 1.7–7.7)
Neutrophils Relative %: 55 % (ref 43–77)
PLATELETS: 268 10*3/uL (ref 150–400)
RBC: 5.23 MIL/uL (ref 4.22–5.81)
RDW: 14.1 % (ref 11.5–15.5)
WBC: 9.5 10*3/uL (ref 4.0–10.5)

## 2015-02-25 LAB — BASIC METABOLIC PANEL
ANION GAP: 10 (ref 5–15)
BUN: 17 mg/dL (ref 6–20)
CALCIUM: 8.9 mg/dL (ref 8.9–10.3)
CO2: 23 mmol/L (ref 22–32)
CREATININE: 1.18 mg/dL (ref 0.61–1.24)
Chloride: 109 mmol/L (ref 101–111)
GFR calc Af Amer: >60 mL/min (ref >60)
Glucose, Bld: 100 mg/dL — ABNORMAL HIGH (ref 65–99)
Potassium: 3.4 mmol/L — ABNORMAL LOW (ref 3.5–5.1)
Sodium: 142 mmol/L (ref 135–145)

## 2015-02-25 MED ORDER — ALBUTEROL SULFATE (2.5 MG/3ML) 0.083% IN NEBU
5.0000 mg | INHALATION_SOLUTION | Freq: Once | RESPIRATORY_TRACT | Status: AC
  Administered 2015-02-25: 5 mg via RESPIRATORY_TRACT
  Filled 2015-02-25: qty 6

## 2015-02-25 MED ORDER — ALBUTEROL SULFATE HFA 108 (90 BASE) MCG/ACT IN AERS
INHALATION_SPRAY | RESPIRATORY_TRACT | Status: AC
  Administered 2015-02-25: 2 via RESPIRATORY_TRACT
  Filled 2015-02-25: qty 6.7

## 2015-02-25 MED ORDER — METHYLPREDNISOLONE SODIUM SUCC 125 MG IJ SOLR
125.0000 mg | Freq: Once | INTRAMUSCULAR | Status: AC
  Administered 2015-02-25: 125 mg via INTRAVENOUS
  Filled 2015-02-25: qty 2

## 2015-02-25 MED ORDER — AZITHROMYCIN 250 MG PO TABS: 250.0000 mg | ORAL_TABLET | Freq: Every day | ORAL | Status: DC

## 2015-02-25 MED ORDER — PREDNISONE 50 MG PO TABS: 50.0000 mg | ORAL_TABLET | Freq: Every day | ORAL | Status: DC

## 2015-02-25 MED ORDER — ALBUTEROL SULFATE HFA 108 (90 BASE) MCG/ACT IN AERS
2.0000 | INHALATION_SPRAY | RESPIRATORY_TRACT | Status: DC | PRN
  Administered 2015-02-25: 2 via RESPIRATORY_TRACT

## 2015-02-25 MED ORDER — ALBUTEROL (5 MG/ML) CONTINUOUS INHALATION SOLN
10.0000 mg/h | INHALATION_SOLUTION | RESPIRATORY_TRACT | Status: AC
  Administered 2015-02-25: 10 mg/h via RESPIRATORY_TRACT
  Filled 2015-02-25: qty 20

## 2015-02-25 MED ORDER — ALBUTEROL (5 MG/ML) CONTINUOUS INHALATION SOLN: 10.0000 mg/h | INHALATION_SOLUTION | RESPIRATORY_TRACT | Status: AC

## 2015-02-25 NOTE — ED Notes (Signed)
Returned from xray

## 2015-02-25 NOTE — ED Provider Notes (Signed)
CSN: [id number]     Arrival date & time 02/25/15  0127 History   First MD Initiated Contact with Patient 02/25/15 [phone]     Chief Complaint  Patient presents with  . Wheezing     (Consider location/radiation/quality/duration/timing/severity/associated sxs/prior Treatment) Patient is a 34 y.o. male presenting with wheezing.  Wheezing Associated symptoms: shortness of breath   Associated symptoms: no chest pain, no chest tightness, no cough, no fever, no headaches, no rash and no sore throat    Patient since with wheezing and shortness of breath starting around 7 PM on 05/14. Denies cough, fever or chills. No chest pain or chest tightness. No lower extremity swelling or pain. Symptoms worse previous to similar asthma attacks. States he's been using his home nebulizer and rescue inhaler with little relief. Past Medical History  Diagnosis Date  . Asthma   . Hypertension    Past Surgical History  Procedure Laterality Date  . Anterior cruciate ligament repair     History reviewed. No pertinent family history. History  Substance Use Topics  . Smoking status: Current Every Day Smoker -- 0.50 packs/day    Types: Cigarettes  . Smokeless tobacco: Not on file  . Alcohol Use: Yes    Review of Systems  Constitutional: Negative for fever and chills.  HENT: Negative for congestion, sinus pressure, sneezing and sore throat.   Respiratory: Positive for shortness of breath and wheezing. Negative for cough and chest tightness.   Cardiovascular: Negative for chest pain and leg swelling.  Gastrointestinal: Negative for nausea, vomiting and abdominal pain.  Musculoskeletal: Negative for back pain.  Skin: Negative for rash and wound.  Neurological: Negative for dizziness, weakness, light-headedness and headaches.  All other systems reviewed and are negative.     Allergies  Peanuts  Home Medications   Prior to Admission medications   Medication Sig Start Date End Date Taking? Authorizing  Provider  albuterol (PROVENTIL HFA;VENTOLIN HFA) 108 (90 BASE) MCG/ACT inhaler Inhale 2 puffs into the lungs every 6 (six) hours as needed. Patient used this medication for wheezing and shortness of breath.    Historical Provider, MD  albuterol (PROVENTIL) (2.5 MG/3ML) 0.083% nebulizer solution Take 3 mLs (2.5 mg total) by nebulization every 6 (six) hours as needed for wheezing or shortness of breath. 06/27/14   Jerelyn ScottMartha Linker, MD  azithromycin (ZITHROMAX) 250 MG tablet Take 1 tablet (250 mg total) by mouth daily. Take 2 tablets on first day and then one tablet every day until finished 02/25/15   Loren Raceravid Ashar Lewinski, MD  hydrochlorothiazide (HYDRODIURIL) 25 MG tablet Take 1 tablet (25 mg total) by mouth daily. 09/30/12   Teressa LowerVrinda Pickering, NP  LISINOPRIL PO Take by mouth.    Historical Provider, MD  predniSONE (DELTASONE) 50 MG tablet Take 1 tablet (50 mg total) by mouth daily. 02/25/15   Loren Raceravid Ercel Pepitone, MD   BP 126/57 mmHg  Pulse 112  Temp(Src) 98.1 F (36.7 C) (Oral)  Resp 20  Ht 5\' 11"  (1.803 m)  Wt 285 lb (129.275 kg)  BMI 39.77 kg/m2  SpO2 98% Physical Exam  Constitutional: He is oriented to person, place, and time. He appears well-developed and well-nourished. No distress.  HENT:  Head: Normocephalic and atraumatic.  Mouth/Throat: Oropharynx is clear and moist. No oropharyngeal exudate.  Eyes: EOM are normal. Pupils are equal, round, and reactive to light.  Neck: Normal range of motion. Neck supple.  Cardiovascular: Normal rate and regular rhythm.   Pulmonary/Chest: Effort normal. No respiratory distress. He has wheezes (inspiratory  and expiratory wheezing in the right lung field. Expiratory wheezing in the left lung field.). He has no rales. He exhibits no tenderness.  Abdominal: Soft. Bowel sounds are normal.  Musculoskeletal: Normal range of motion. He exhibits no edema or tenderness.  No calf swelling or tenderness.  Neurological: He is alert and oriented to person, place, and time.   Moves all extremities without deficit. Sensation is grossly intact.  Skin: Skin is warm and dry. No rash noted. No erythema.  Psychiatric: He has a normal mood and affect. His behavior is normal.  Nursing note and vitals reviewed.   ED Course  Procedures (including critical care time) Labs Review Labs Reviewed  BASIC METABOLIC PANEL - Abnormal; Notable for the following:    Potassium 3.4 (*)    Glucose, Bld 100 (*)    All other components within normal limits  CBC WITH DIFFERENTIAL/PLATELET    Imaging Review Dg Chest 2 View  02/25/2015   CLINICAL DATA:  Acute onset of shortness of breath and wheezing. Initial encounter.  EXAM: CHEST  2 VIEW  COMPARISON:  None.  FINDINGS: The lungs are well-aerated. Mild right basilar and mid lung airspace opacity could reflect mild infection. Underlying peribronchial thickening is noted. There is no evidence of pleural effusion or pneumothorax.  The heart is normal in size; the mediastinal contour is within normal limits. No acute osseous abnormalities are seen.  IMPRESSION: Mild right basilar and mid lung airspace opacity could reflect mild pneumonia. Mild underlying peribronchial thickening noted.   Electronically Signed   By: Roanna RaiderJeffery  Chang M.D.   On: 02/25/2015 02:30     EKG Interpretation None      MDM   Final diagnoses:  Asthma exacerbation    No improvement with initial reading treatment. Give IM steroids and hour-long continuous neb.  Patient with significant air movement and decreased wheezing after second hour-long nebulizer. Questionable mild pneumonia on chest x-ray the patient does not have productive cough, fever or elevation in white blood cell count. We'll give Z-Pak and have follow-up with his primary physician. Return precautions have been given.  Loren Raceravid Lakeya Mulka, MD 02/25/15 [phone]

## 2015-02-25 NOTE — ED Notes (Addendum)
H/o asthma, c/o sob, tightness & wheezing, onset 1700, (denies: pain, cough, fever, nvd), "breathing txs were helping, but not any longer", last neb at home PTA. Reports never admitted for the same, last flare upe ~ 2 months ago, also has **allergy to peanuts**. Wheezing and increased wob noted. Pt alert, NAD, calm, interactive. [facility]

## 2015-02-25 NOTE — ED Notes (Signed)
Breathing treatment still in progress.

## 2015-02-25 NOTE — ED Notes (Signed)
I took BP again with larger cuff, got best result of 143/87.

## 2015-02-25 NOTE — ED Notes (Signed)
MD with pt  

## 2015-02-25 NOTE — Discharge Instructions (Signed)

## 2015-02-25 NOTE — ED Notes (Addendum)
Continuous breathing treatment in progess.

## 2015-02-25 NOTE — ED Notes (Signed)
[facility], initiating neb tx.

## 2015-02-25 NOTE — ED Notes (Signed)
Pt transported to xray 

## 2016-06-12 ENCOUNTER — Emergency Department (HOSPITAL_BASED_OUTPATIENT_CLINIC_OR_DEPARTMENT_OTHER)
Admission: EM | Admit: 2016-06-12 | Discharge: 2016-06-12 | Disposition: A | Discharge status: Home / Self Care | Payer: 59 | Attending: Emergency Medicine | Admitting: Emergency Medicine

## 2016-06-12 ENCOUNTER — Encounter (HOSPITAL_BASED_OUTPATIENT_CLINIC_OR_DEPARTMENT_OTHER): Payer: Self-pay | Admitting: Emergency Medicine

## 2016-06-12 DIAGNOSIS — E876 Hypokalemia: Secondary | ICD-10-CM

## 2016-06-12 DIAGNOSIS — F17210 Nicotine dependence, cigarettes, uncomplicated: Secondary | ICD-10-CM | POA: Diagnosis not present

## 2016-06-12 DIAGNOSIS — R51 Headache: Secondary | ICD-10-CM | POA: Diagnosis present

## 2016-06-12 DIAGNOSIS — B349 Viral infection, unspecified: Secondary | ICD-10-CM | POA: Diagnosis not present

## 2016-06-12 DIAGNOSIS — Z79899 Other long term (current) drug therapy: Secondary | ICD-10-CM | POA: Insufficient documentation

## 2016-06-12 DIAGNOSIS — J45909 Unspecified asthma, uncomplicated: Secondary | ICD-10-CM | POA: Insufficient documentation

## 2016-06-12 DIAGNOSIS — I10 Essential (primary) hypertension: Secondary | ICD-10-CM | POA: Insufficient documentation

## 2016-06-12 LAB — BASIC METABOLIC PANEL
Anion gap: 8 (ref 5–15)
BUN: 17 mg/dL (ref 6–20)
CO2: 26 mmol/L (ref 22–32)
Calcium: 9 mg/dL (ref 8.9–10.3)
Chloride: 102 mmol/L (ref 101–111)
Creatinine, Ser: 1.29 mg/dL — ABNORMAL HIGH (ref 0.61–1.24)
GFR calc Af Amer: >60 mL/min (ref >60)
GFR calc non Af Amer: >60 mL/min (ref >60)
Glucose, Bld: 96 mg/dL (ref 65–99)
POTASSIUM: 3.2 mmol/L — AB (ref 3.5–5.1)
SODIUM: 136 mmol/L (ref 135–145)

## 2016-06-12 LAB — CBC WITH DIFFERENTIAL/PLATELET
BASOS PCT: 1 %
Basophils Absolute: 0.1 10*3/uL (ref 0.0–0.1)
Eosinophils Absolute: 0.7 10*3/uL (ref 0.0–0.7)
Eosinophils Relative: 6 %
HCT: 45.3 % (ref 39.0–52.0)
Hemoglobin: 15.6 g/dL (ref 13.0–17.0)
Lymphocytes Relative: 42 %
Lymphs Abs: 4.7 10*3/uL — ABNORMAL HIGH (ref 0.7–4.0)
MCH: 28.7 pg (ref 26.0–34.0)
MCHC: 34.4 g/dL (ref 30.0–36.0)
MCV: 83.3 fL (ref 78.0–100.0)
MONO ABS: 1.1 10*3/uL — AB (ref 0.1–1.0)
MONOS PCT: 10 %
Neutro Abs: 4.4 10*3/uL (ref 1.7–7.7)
Neutrophils Relative %: 41 %
Platelets: 256 10*3/uL (ref 150–400)
RBC: 5.44 MIL/uL (ref 4.22–5.81)
RDW: 14.7 % (ref 11.5–15.5)
WBC: 10.9 10*3/uL — ABNORMAL HIGH (ref 4.0–10.5)

## 2016-06-12 LAB — MONONUCLEOSIS SCREEN: MONO SCREEN: NEGATIVE

## 2016-06-12 MED ORDER — POTASSIUM CHLORIDE CRYS ER 20 MEQ PO TBCR
40.0000 meq | EXTENDED_RELEASE_TABLET | Freq: Once | ORAL | Status: AC
  Administered 2016-06-12: 40 meq via ORAL
  Filled 2016-06-12: qty 2

## 2016-06-12 NOTE — ED Triage Notes (Signed)
Pt also reports mild HA with nausea.

## 2016-06-12 NOTE — ED Triage Notes (Signed)
Pt c/o feeling light headed and dizzy x 2 days. Pt states he has been taking his BP medications at prescribed.

## 2016-06-12 NOTE — ED Notes (Signed)
C/o ha,pain behinds eyes and left ear,  nausea, tingling weakness, increased sleeping x 2 days

## 2016-06-12 NOTE — ED Provider Notes (Signed)
[facility]   CSN: [id number] Arrival date & time: 06/12/16  0119     History   Chief Complaint Chief Complaint  Patient presents with  . Dizziness    HPI Charles Collier is a 35 y.o. male with a history of asthma and hypertension. The past 2 weeks he has felt generalized weakness and increased sleep. He was restarted on testosterone supplementation about a month ago.  For the past 2 days he's been having headaches. The headaches are on the left side of his head. He describes the sensation as being constant but mild and dull. He feels the headaches behind his left eye. The headaches have been associated with nausea and some light headedness. He also had some paresthesias in his left middle and ring finger which are transient and no longer present. He denies pain or paresthesias with movement of his neck. Thinking his symptoms were due to hypertension he took an extra dose of his lisinopril and hydrochlorothiazide. He also took Tylenol with relief of his headache.  He denies chest pain, shortness of breath or vomiting. He was constipated recently and took MiraLAX which has loosened his stools.  HPI  Past Medical History:  Diagnosis Date  . Asthma   . Hypertension     There are no active problems to display for this patient.   Past Surgical History:  Procedure Laterality Date  . ANTERIOR CRUCIATE LIGAMENT REPAIR         Home Medications    Prior to Admission medications   Medication Sig Start Date End Date Taking? Authorizing Provider  albuterol (PROVENTIL HFA;VENTOLIN HFA) 108 (90 BASE) MCG/ACT inhaler Inhale 2 puffs into the lungs every 6 (six) hours as needed. Patient used this medication for wheezing and shortness of breath.    Historical Provider, MD  albuterol (PROVENTIL) (2.5 MG/3ML) 0.083% nebulizer solution Take 3 mLs (2.5 mg total) by nebulization every 6 (six) hours as needed for wheezing or shortness of breath. 06/27/14   Jerelyn Scott, MD  azithromycin (ZITHROMAX) 250 MG tablet Take 1 tablet (250 mg total) by mouth daily. Take 2 tablets on first day and then one tablet every day until finished 02/25/15   Loren Racer, MD  hydrochlorothiazide (HYDRODIURIL) 25 MG tablet Take 1 tablet (25 mg total) by mouth daily. 09/30/12   Teressa Lower, NP  LISINOPRIL PO Take by mouth.    Historical Provider, MD  predniSONE (DELTASONE) 50 MG tablet Take 1 tablet (50 mg total) by mouth daily. 02/25/15   Loren Racer, MD    Family History No family history on file.  Social History Social History  Substance Use Topics  . Smoking status: Current Every Day Smoker    Packs/day: 0.50    Types: Cigarettes  . Smokeless tobacco: Never Used  . Alcohol use Yes     Allergies   Peanuts [peanut oil]   Review of Systems Review of Systems  All other systems reviewed and are negative.   Physical Exam Updated Vital Signs BP 137/87 (BP Location: Left Arm)   Pulse (!) 59   Temp 98.1 F (36.7 C) (Oral)   Resp 16   Ht 5\' 11"  (1.803 m)   Wt 265 lb (120.2 kg)   SpO2 97%   BMI 36.96 kg/m   Physical Exam General: Well-developed, well-nourished male in no acute distress; appearance consistent with age of record HENT: normocephalic; atraumatic Eyes: pupils equal, round and reactive to light; extraocular muscles intact Neck: supple; full range of  motion Heart: regular rate and rhythm Lungs: clear to auscultation bilaterally Abdomen: soft; nondistended; nontender; no masses or hepatosplenomegaly; bowel sounds present Extremities: No deformity; full range of motion; pulses normal Neurologic: Awake, alert and oriented; motor function intact in all extremities and symmetric; no facial droop Skin: Warm and dry Psychiatric: Normal mood and affect    ED Treatments / Results  Nursing notes and vitals signs, including pulse oximetry, reviewed.  Summary of this visit's results, reviewed by myself:  Labs:  Results for orders  placed or performed during the hospital encounter of 06/12/16 (from the past 24 hour(s))  CBC with Differential/Platelet     Status: Abnormal   Collection Time: 06/12/16  1:50 AM  Result Value Ref Range   WBC 10.9 (H) 4.0 - 10.5 K/uL   RBC 5.44 4.22 - 5.81 MIL/uL   Hemoglobin 15.6 13.0 - 17.0 g/dL   HCT 09.845.3 11.939.0 - 14.752.0 %   MCV 83.3 78.0 - 100.0 fL   MCH 28.7 26.0 - 34.0 pg   MCHC 34.4 30.0 - 36.0 g/dL   RDW 82.914.7 56.211.5 - 13.015.5 %   Platelets 256 150 - 400 K/uL   Neutrophils Relative % 41 %   Neutro Abs 4.4 1.7 - 7.7 K/uL   Lymphocytes Relative 42 %   Lymphs Abs 4.7 (H) 0.7 - 4.0 K/uL   Monocytes Relative 10 %   Monocytes Absolute 1.1 (H) 0.1 - 1.0 K/uL   Eosinophils Relative 6 %   Eosinophils Absolute 0.7 0.0 - 0.7 K/uL   Basophils Relative 1 %   Basophils Absolute 0.1 0.0 - 0.1 K/uL  Basic metabolic panel     Status: Abnormal   Collection Time: 06/12/16  1:50 AM  Result Value Ref Range   Sodium 136 135 - 145 mmol/L   Potassium 3.2 (L) 3.5 - 5.1 mmol/L   Chloride 102 101 - 111 mmol/L   CO2 26 22 - 32 mmol/L   Glucose, Bld 96 65 - 99 mg/dL   BUN 17 6 - 20 mg/dL   Creatinine, Ser 8.651.29 (H) 0.61 - 1.24 mg/dL   Calcium 9.0 8.9 - 78.410.3 mg/dL   GFR calc non Af Amer >60 >60 mL/min   GFR calc Af Amer >60 >60 mL/min   Anion gap 8 5 - 15  Mononucleosis screen     Status: None   Collection Time: 06/12/16  3:10 AM  Result Value Ref Range   Mono Screen NEGATIVE NEGATIVE   EKG  EKG Interpretation  Date/Time:  Thursday June 12 2016 01:54:02 EDT Ventricular Rate:  55 PR Interval:  168 QRS Duration: 104 QT Interval:  400 QTC Calculation: 382 R Axis:   60 Text Interpretation:  Sinus bradycardia Early repolarization No previous ECGs available Confirmed by Trinity Hyland  MD, Jonny RuizJOHN ([id number]) on 06/12/2016 2:01:08 AM      3:47 AM History laboratory studies are most consistent with an acute viral illness.  Procedures (including critical care time)   Final Clinical Impressions(s) / ED  Diagnoses   Final diagnoses:  Viral illness  Hypokalemia      Paula LibraJohn Skyelynn Rambeau, MD 06/12/16 [id number]

## 2016-09-29 ENCOUNTER — Emergency Department (HOSPITAL_BASED_OUTPATIENT_CLINIC_OR_DEPARTMENT_OTHER)
Admission: EM | Admit: 2016-09-29 | Discharge: 2016-09-29 | Disposition: A | Discharge status: Home / Self Care | Payer: 59 | Attending: Emergency Medicine | Admitting: Emergency Medicine

## 2016-09-29 ENCOUNTER — Emergency Department (HOSPITAL_BASED_OUTPATIENT_CLINIC_OR_DEPARTMENT_OTHER): Payer: 59

## 2016-09-29 ENCOUNTER — Encounter (HOSPITAL_BASED_OUTPATIENT_CLINIC_OR_DEPARTMENT_OTHER): Payer: Self-pay | Admitting: *Deleted

## 2016-09-29 DIAGNOSIS — S40022A Contusion of left upper arm, initial encounter: Secondary | ICD-10-CM | POA: Diagnosis not present

## 2016-09-29 DIAGNOSIS — X500XXA Overexertion from strenuous movement or load, initial encounter: Secondary | ICD-10-CM | POA: Insufficient documentation

## 2016-09-29 DIAGNOSIS — I10 Essential (primary) hypertension: Secondary | ICD-10-CM | POA: Diagnosis not present

## 2016-09-29 DIAGNOSIS — J45909 Unspecified asthma, uncomplicated: Secondary | ICD-10-CM | POA: Insufficient documentation

## 2016-09-29 DIAGNOSIS — M791 Myalgia: Secondary | ICD-10-CM | POA: Diagnosis not present

## 2016-09-29 DIAGNOSIS — Y999 Unspecified external cause status: Secondary | ICD-10-CM | POA: Insufficient documentation

## 2016-09-29 DIAGNOSIS — F17210 Nicotine dependence, cigarettes, uncomplicated: Secondary | ICD-10-CM | POA: Insufficient documentation

## 2016-09-29 DIAGNOSIS — Z79899 Other long term (current) drug therapy: Secondary | ICD-10-CM | POA: Diagnosis not present

## 2016-09-29 DIAGNOSIS — Y9389 Activity, other specified: Secondary | ICD-10-CM | POA: Diagnosis not present

## 2016-09-29 DIAGNOSIS — Y929 Unspecified place or not applicable: Secondary | ICD-10-CM | POA: Insufficient documentation

## 2016-09-29 DIAGNOSIS — S4992XA Unspecified injury of left shoulder and upper arm, initial encounter: Secondary | ICD-10-CM | POA: Diagnosis present

## 2016-09-29 DIAGNOSIS — Z91010 Allergy to peanuts: Secondary | ICD-10-CM | POA: Insufficient documentation

## 2016-09-29 MED ORDER — NAPROXEN 500 MG PO TABS: 500.0000 mg | ORAL_TABLET | Freq: Two times a day (BID) | ORAL | 0 refills | Status: DC

## 2016-09-29 MED ORDER — CEPHALEXIN 500 MG PO CAPS: 500.0000 mg | ORAL_CAPSULE | Freq: Four times a day (QID) | ORAL | 0 refills | Status: DC

## 2016-09-29 NOTE — ED Provider Notes (Signed)
[facility]   CSN: [id number] Arrival date & time: 09/29/16  1543  By signing my name below, I, Freida Busmaniana Omoyeni and Talbert NanPaul Grant, attest that this documentation has been prepared under the direction and in the presence of Meridith Romick, PA-C. Electronically Signed: Freida Busmaniana Omoyeni, Scribe. 09/29/2016. 5:21 PM.   History   Chief Complaint Chief Complaint  Patient presents with  . Arm Injury    HPI HPI Comments:  Charles Collier is a 35 y.o. male who presents to the Emergency Department complaining of moderate acute onset waxing and waning left upper arm pain. The pain began 2 weeks ago when he was lifting an entertainment center leg and it hit his arm. Pain improved and then was exacerbated by lifting weights today. The patient complains of associated swelling in his arm. He has taken Aleve with moderate relief. Pt states No shot or injection at site. Pt does not have an orthopedist.    The history is provided by the patient. No language interpreter was used.    Past Medical History:  Diagnosis Date  . Asthma   . Hypertension     There are no active problems to display for this patient.   Past Surgical History:  Procedure Laterality Date  . ANTERIOR CRUCIATE LIGAMENT REPAIR         Home Medications    Prior to Admission medications   Medication Sig Start Date End Date Taking? Authorizing Provider  albuterol (PROVENTIL HFA;VENTOLIN HFA) 108 (90 BASE) MCG/ACT inhaler Inhale 2 puffs into the lungs every 6 (six) hours as needed. Patient used this medication for wheezing and shortness of breath.   Yes Historical Provider, MD  albuterol (PROVENTIL) (2.5 MG/3ML) 0.083% nebulizer solution Take 3 mLs (2.5 mg total) by nebulization every 6 (six) hours as needed for wheezing or shortness of breath. 06/27/14  Yes Jerelyn ScottMartha Linker, MD  LISINOPRIL PO Take by mouth.   Yes Historical Provider, MD  testosterone cypionate (DEPOTESTOSTERONE CYPIONATE) 200 MG/ML  injection Inject into the muscle every 14 (fourteen) days.   Yes Historical Provider, MD    Family History No family history on file.  Social History Social History  Substance Use Topics  . Smoking status: Current Every Day Smoker    Packs/day: 0.50    Types: Cigarettes  . Smokeless tobacco: Never Used  . Alcohol use Yes     Allergies   Peanuts [peanut oil]   Review of Systems Review of Systems  Musculoskeletal: Positive for myalgias. Negative for neck pain.     Physical Exam Updated Vital Signs BP 156/87 (BP Location: Left Arm)   Pulse 64   Temp 98.1 F (36.7 C) (Oral)   Resp 20   Ht 5\' 11"  (1.803 m)   Wt 275 lb (124.7 kg)   SpO2 98%   BMI 38.35 kg/m   Physical Exam  Constitutional: He is oriented to person, place, and time. He appears well-developed and well-nourished. No distress.  HENT:  Head: Normocephalic and atraumatic.  Eyes: Conjunctivae are normal.  Cardiovascular: Normal rate.   Pulmonary/Chest: Effort normal.  Abdominal: He exhibits no distension.  Musculoskeletal:  Bruising and swelling noted to the left distal bicep. There is area of induration to the distal bicep. Tender to palpation. Full range of motion of the shoulder, elbow, forearm with no pain with bicep flexion. Pain with full bicep extension. There is surrounding erythema around the bruised area. Distal radial pulses intact  Neurological: He is alert and oriented to person, place,  and time.  Skin: Skin is warm and dry.  Psychiatric: He has a normal mood and affect.  Nursing note and vitals reviewed.    ED Treatments / Results   DIAGNOSTIC STUDIES:  Oxygen Saturation is 98% on room air, normal by my interpretation.    COORDINATION OF CARE:  5:22 PM Discussed treatment plan with pt at bedside and pt agreed to plan.   Labs (all labs ordered are listed, but only abnormal results are displayed) Labs Reviewed - No data to display  EKG  EKG Interpretation None        Radiology [place] Venous Img Upper Uni Left  Result Date: 09/29/2016 CLINICAL DATA:  Medial left arm pain and palpable abnormality EXAM: Left UPPER EXTREMITY VENOUS DOPPLER ULTRASOUND TECHNIQUE: Gray-scale sonography with graded compression, as well as color Doppler and duplex ultrasound were performed to evaluate the upper extremity deep venous system from the level of the subclavian vein and including the jugular, axillary, basilic, radial, ulnar and upper cephalic vein. Spectral Doppler was utilized to evaluate flow at rest and with distal augmentation maneuvers. COMPARISON:  None. FINDINGS: Contralateral Subclavian Vein: Respiratory phasicity is normal and symmetric with the symptomatic side. No evidence of thrombus. Normal compressibility. Internal Jugular Vein: No evidence of thrombus. Normal compressibility, respiratory phasicity and response to augmentation. Subclavian Vein: No evidence of thrombus. Normal compressibility, respiratory phasicity and response to augmentation. Axillary Vein: No evidence of thrombus. Normal compressibility, respiratory phasicity and response to augmentation. Cephalic Vein: No evidence of thrombus. Normal compressibility, respiratory phasicity and response to augmentation. Basilic Vein: No evidence of thrombus. Normal compressibility, respiratory phasicity and response to augmentation. Brachial Veins: No evidence of thrombus. Normal compressibility, respiratory phasicity and response to augmentation. Radial Veins: No evidence of thrombus. Normal compressibility, respiratory phasicity and response to augmentation. Ulnar Veins: No evidence of thrombus. Normal compressibility, respiratory phasicity and response to augmentation. Venous Reflux:  None visualized. Other Findings: The area of concern is located at the medial aspect of the distal upper arm, overlying the biceps. In this area there is mild focal alteration of soft tissue echotexture, possibly a mildly prominent node  measuring 0.8 cm. No abscess or drainable collection is evident. No vascular abnormality is evident. IMPRESSION: No evidence of deep venous thrombosis. Subcentimeter focal alteration of soft tissue echotexture at the area of concern, without evidence of abscess, drainable collection or vascular abnormality. Electronically Signed   By: Ellery Plunkaniel R Mitchell M.D.   On: 09/29/2016 18:58    Procedures Procedures (including critical care time)  Medications Ordered in ED Medications - No data to display   Initial Impression / Assessment and Plan / ED Course  I have reviewed the triage vital signs and the nursing notes.  Pertinent labs & imaging results that were available during my care of the patient were reviewed by me and considered in my medical decision making (see chart for details).  Clinical Course   Patient emergency department with injury to the left upper arm. Based on the history, concerning for muscle versus tendon injury or possible hematoma to the area. There is some erythema around the area and it is warm to the touch. Venous Doppler obtained and is negative for blood clot, however it was noted that there was an area of soft tissue echotexture at the area of concern, but no evidence of abscesses or drainable collection of fluid. Question possible cellulitis over the hematoma versus muscular injury. Patient has full range of motion of the elbow and normal strength of  the biceps. Discussed with Dr. Adela Lank. Will treat with ice, elevation, Keflex, follow with orthopedics. Return precautions discussed   Vitals:   09/29/16 1553 09/29/16 1554  BP: 156/87   Pulse: 64   Resp: 20   Temp: 98.1 F (36.7 C)   TempSrc: Oral   SpO2: 98%   Weight:  124.7 kg  Height:  5\' 11"  (1.803 m)    Final Clinical Impressions(s) / ED Diagnoses   Final diagnoses:  Traumatic hematoma of left upper arm, initial encounter    New Prescriptions Discharge Medication List as of 09/29/2016  7:11 PM    START  taking these medications   Details  cephALEXin (KEFLEX) 500 MG capsule Take 1 capsule (500 mg total) by mouth 4 (four) times daily., Starting Mon 09/29/2016, Print    naproxen (NAPROSYN) 500 MG tablet Take 1 tablet (500 mg total) by mouth 2 (two) times daily., Starting Mon 09/29/2016, Print         Jaynie Crumble, PA-C 09/30/16 0108    Melene Plan, DO 09/30/16 1624

## 2016-09-29 NOTE — Discharge Instructions (Signed)
Your ultrasound is negative for blood clot. I am concerned about possible infection around the hematoma in your arm. Keep close eye for spreading redness and swelling. Apply ice pack, elevate. Take Keflex as prescribed until all gone. Please follow-up with orthopedics doctor to rule out muscle or tendon injury. Return if worsening symptoms or develop high fever.

## 2016-09-29 NOTE — ED Triage Notes (Signed)
Pt states he was moving furniture 2 weeks ago. C/o left arm pain May have reinjured self recently.

## 2016-12-03 DIAGNOSIS — I10 Essential (primary) hypertension: Secondary | ICD-10-CM | POA: Diagnosis not present

## 2016-12-03 DIAGNOSIS — E785 Hyperlipidemia, unspecified: Secondary | ICD-10-CM | POA: Diagnosis not present

## 2016-12-03 DIAGNOSIS — E349 Endocrine disorder, unspecified: Secondary | ICD-10-CM | POA: Diagnosis not present

## 2016-12-03 DIAGNOSIS — J4530 Mild persistent asthma, uncomplicated: Secondary | ICD-10-CM | POA: Diagnosis not present

## 2016-12-09 DIAGNOSIS — Z0000 Encounter for general adult medical examination without abnormal findings: Secondary | ICD-10-CM | POA: Diagnosis not present

## 2017-02-09 DIAGNOSIS — J4531 Mild persistent asthma with (acute) exacerbation: Secondary | ICD-10-CM | POA: Diagnosis not present

## 2017-02-09 DIAGNOSIS — J4530 Mild persistent asthma, uncomplicated: Secondary | ICD-10-CM | POA: Diagnosis not present

## 2017-02-09 DIAGNOSIS — J309 Allergic rhinitis, unspecified: Secondary | ICD-10-CM | POA: Diagnosis not present

## 2017-02-20 ENCOUNTER — Encounter (HOSPITAL_BASED_OUTPATIENT_CLINIC_OR_DEPARTMENT_OTHER): Payer: Self-pay | Admitting: Emergency Medicine

## 2017-02-20 ENCOUNTER — Emergency Department (HOSPITAL_BASED_OUTPATIENT_CLINIC_OR_DEPARTMENT_OTHER): Payer: 59

## 2017-02-20 ENCOUNTER — Inpatient Hospital Stay (HOSPITAL_BASED_OUTPATIENT_CLINIC_OR_DEPARTMENT_OTHER)
Admission: EM | Admit: 2017-02-20 | Discharge: 2017-02-22 | DRG: 202 | Disposition: A | Discharge status: Home / Self Care | Payer: 59 | Attending: Internal Medicine | Admitting: Internal Medicine

## 2017-02-20 DIAGNOSIS — R079 Chest pain, unspecified: Secondary | ICD-10-CM | POA: Diagnosis not present

## 2017-02-20 DIAGNOSIS — E875 Hyperkalemia: Secondary | ICD-10-CM | POA: Diagnosis present

## 2017-02-20 DIAGNOSIS — Z7951 Long term (current) use of inhaled steroids: Secondary | ICD-10-CM

## 2017-02-20 DIAGNOSIS — J4551 Severe persistent asthma with (acute) exacerbation: Secondary | ICD-10-CM | POA: Diagnosis not present

## 2017-02-20 DIAGNOSIS — Z7952 Long term (current) use of systemic steroids: Secondary | ICD-10-CM | POA: Diagnosis not present

## 2017-02-20 DIAGNOSIS — N179 Acute kidney failure, unspecified: Secondary | ICD-10-CM | POA: Diagnosis not present

## 2017-02-20 DIAGNOSIS — J9621 Acute and chronic respiratory failure with hypoxia: Secondary | ICD-10-CM | POA: Diagnosis not present

## 2017-02-20 DIAGNOSIS — Z91010 Allergy to peanuts: Secondary | ICD-10-CM

## 2017-02-20 DIAGNOSIS — E872 Acidosis: Secondary | ICD-10-CM

## 2017-02-20 DIAGNOSIS — R0902 Hypoxemia: Secondary | ICD-10-CM

## 2017-02-20 DIAGNOSIS — J4542 Moderate persistent asthma with status asthmaticus: Secondary | ICD-10-CM | POA: Diagnosis not present

## 2017-02-20 DIAGNOSIS — E8729 Other acidosis: Secondary | ICD-10-CM

## 2017-02-20 DIAGNOSIS — E291 Testicular hypofunction: Secondary | ICD-10-CM | POA: Diagnosis not present

## 2017-02-20 DIAGNOSIS — I10 Essential (primary) hypertension: Secondary | ICD-10-CM | POA: Diagnosis present

## 2017-02-20 DIAGNOSIS — J45998 Other asthma: Secondary | ICD-10-CM | POA: Diagnosis not present

## 2017-02-20 HISTORY — DX: Obstructive sleep apnea (adult) (pediatric): G47.33

## 2017-02-20 HISTORY — DX: Dependence on other enabling machines and devices: Z99.89

## 2017-02-20 LAB — CBC WITH DIFFERENTIAL/PLATELET
Basophils Absolute: 0 10*3/uL (ref 0.0–0.1)
Basophils Relative: 0 %
Eosinophils Absolute: 0.8 10*3/uL — ABNORMAL HIGH (ref 0.0–0.7)
Eosinophils Relative: 7 %
HCT: 46.9 % (ref 39.0–52.0)
Hemoglobin: 16.1 g/dL (ref 13.0–17.0)
LYMPHS PCT: 42 %
Lymphs Abs: 4.5 10*3/uL — ABNORMAL HIGH (ref 0.7–4.0)
MCH: 28.9 pg (ref 26.0–34.0)
MCHC: 34.3 g/dL (ref 30.0–36.0)
MCV: 84.1 fL (ref 78.0–100.0)
MONO ABS: 1.1 10*3/uL — AB (ref 0.1–1.0)
Monocytes Relative: 11 %
Neutro Abs: 4.2 10*3/uL (ref 1.7–7.7)
Neutrophils Relative %: 40 %
Platelets: 296 10*3/uL (ref 150–400)
RBC: 5.58 MIL/uL (ref 4.22–5.81)
RDW: 14.1 % (ref 11.5–15.5)
WBC: 10.7 10*3/uL — ABNORMAL HIGH (ref 4.0–10.5)

## 2017-02-20 LAB — I-STAT ARTERIAL BLOOD GAS, ED
ACID-BASE DEFICIT: 1 mmol/L (ref 0.0–2.0)
Bicarbonate: 25.8 mmol/L (ref 20.0–28.0)
O2 Saturation: 100 %
TCO2: 27 mmol/L (ref 0–100)
pCO2 arterial: 51.4 mmHg — ABNORMAL HIGH (ref 32.0–48.0)
pH, Arterial: 7.31 — ABNORMAL LOW (ref 7.350–7.450)
pO2, Arterial: 285 mmHg — ABNORMAL HIGH (ref 83.0–108.0)

## 2017-02-20 LAB — COMPREHENSIVE METABOLIC PANEL
ALT: 34 U/L (ref 17–63)
AST: 35 U/L (ref 15–41)
Albumin: 4.4 g/dL (ref 3.5–5.0)
Alkaline Phosphatase: 54 U/L (ref 38–126)
Anion gap: 8 (ref 5–15)
BUN: 19 mg/dL (ref 6–20)
CHLORIDE: 107 mmol/L (ref 101–111)
CO2: 26 mmol/L (ref 22–32)
Calcium: 9.1 mg/dL (ref 8.9–10.3)
Creatinine, Ser: 1.51 mg/dL — ABNORMAL HIGH (ref 0.61–1.24)
GFR calc non Af Amer: 58 mL/min — ABNORMAL LOW (ref >60)
Glucose, Bld: 101 mg/dL — ABNORMAL HIGH (ref 65–99)
POTASSIUM: 3.8 mmol/L (ref 3.5–5.1)
Sodium: 141 mmol/L (ref 135–145)
Total Bilirubin: 0.5 mg/dL (ref 0.3–1.2)
Total Protein: 7.7 g/dL (ref 6.5–8.1)

## 2017-02-20 MED ORDER — ALBUTEROL SULFATE (2.5 MG/3ML) 0.083% IN NEBU
5.0000 mg | INHALATION_SOLUTION | Freq: Once | RESPIRATORY_TRACT | Status: DC
  Filled 2017-02-20: qty 6

## 2017-02-20 MED ORDER — METHYLPREDNISOLONE SODIUM SUCC 125 MG IJ SOLR
INTRAMUSCULAR | Status: AC
  Administered 2017-02-20: 125 mg
  Filled 2017-02-20: qty 2

## 2017-02-20 MED ORDER — ALBUTEROL (5 MG/ML) CONTINUOUS INHALATION SOLN
5.0000 mg/h | INHALATION_SOLUTION | RESPIRATORY_TRACT | Status: DC
Start: 2017-02-20 — End: 2017-02-21
  Administered 2017-02-20: 5 mg/h via RESPIRATORY_TRACT

## 2017-02-20 MED ORDER — MAGNESIUM SULFATE 2 GM/50ML IV SOLN
INTRAVENOUS | Status: AC
  Administered 2017-02-20: 2 g
  Filled 2017-02-20: qty 50

## 2017-02-20 MED ORDER — ALBUTEROL (5 MG/ML) CONTINUOUS INHALATION SOLN
10.0000 mg/h | INHALATION_SOLUTION | RESPIRATORY_TRACT | Status: AC
  Administered 2017-02-20: 10 mg/h via RESPIRATORY_TRACT
  Filled 2017-02-20: qty 20

## 2017-02-20 NOTE — Progress Notes (Signed)
Decreased patient's FIO2 to 60%.  Patient tolerated well and his SPO2 remains at 100%.

## 2017-02-20 NOTE — Progress Notes (Signed)
36 yo M with persistent asthma, HTN, and hypogonadism presents with asthma exacerbation.  BP (!) 130/58   Pulse (!) 115   Temp 98.8 F (37.1 C) (Oral)   Resp 20   Ht 5\' 11"  (1.803 m)   Wt 117.9 kg (260 lb)   SpO2 100%   BMI 36.26 kg/m   SpO2 initially 86%, RR 38 and HR 121 Got CAT, mag, and solu-medrol, started on BiPAP, parameters improved.   Now appears resting comfortably on BiPAP. ABG pending.   To stepdown inpatient status for asthma exacerbation.

## 2017-02-20 NOTE — ED Provider Notes (Signed)
[facility] Provider Note   CSN: [id number] Arrival date & time: 02/20/17  2122  By signing my name below, I, Charles Collier, attest that this documentation has been prepared under the direction and in the presence of Charles Collier, Charles Garnet, MD. Electronically Signed: Rosana Collier, ED Scribe. 02/20/17. 10:50 PM.  History   Chief Complaint Chief Complaint  Patient presents with  . Shortness of Breath   The history is provided by the patient and the spouse. No language interpreter was used.    HPI Comments: Charles Collier is a 36 y.o. male with a PMHx of asthma and HTN, who presents to the Emergency Department complaining of persistent, gradually worsening shortness of breath onset this morning. Pt has a h/o asthma and notes that his symptoms today are consistent with a flare up of this. He has tried taking his albuterol inhaler and nebulizer treatments at home w/o significant relief of his symtpoms. Per pt, he went to Urgent Care earlier this evening and was prescribed a taper of Prednisone; he took one dose of this tonight which also has not improved his symtpoms. He does currently take Advair daily. No prior intubations related to his asthma. He does have a h/o peanut allergy, but has not been exposed to this recently. Pt denies fever, cough, or any other complaints or symptoms at this time.   Past Medical History:  Diagnosis Date  . Asthma   . Hypertension    Patient Active Problem List   Diagnosis Date Noted  . Moderate persistent asthma with status asthmaticus 02/20/2017  . Acute on chronic respiratory failure with hypoxia (HCC) 02/20/2017  . Essential hypertension 03/19/2013   Past Surgical History:  Procedure Laterality Date  . ANTERIOR CRUCIATE LIGAMENT REPAIR      Home Medications    Prior to Admission medications   Medication Sig Start Date End Date Taking? Authorizing Provider  albuterol (PROVENTIL HFA;VENTOLIN HFA) 108 (90 BASE) MCG/ACT inhaler  Inhale 2 puffs into the lungs every 6 (six) hours as needed. Patient used this medication for wheezing and shortness of breath.    [provider]  albuterol (PROVENTIL) (2.5 MG/3ML) 0.083% nebulizer solution Take 3 mLs (2.5 mg total) by nebulization every 6 (six) hours as needed for wheezing or shortness of breath. 06/27/14   Jerelyn Scott, MD  cephALEXin (KEFLEX) 500 MG capsule Take 1 capsule (500 mg total) by mouth 4 (four) times daily. 09/29/16   Kirichenko, Tatyana, PA-C  LISINOPRIL PO Take by mouth.    [provider]  naproxen (NAPROSYN) 500 MG tablet Take 1 tablet (500 mg total) by mouth 2 (two) times daily. 09/29/16   Kirichenko, Lemont Fillers, PA-C  testosterone cypionate (DEPOTESTOSTERONE CYPIONATE) 200 MG/ML injection Inject into the muscle every 14 (fourteen) days.    [provider]    Family History History reviewed. No pertinent family history.  Social History Social History  Substance Use Topics  . Smoking status: Current Every Day Smoker    Packs/day: 0.50    Types: Cigarettes  . Smokeless tobacco: Never Used  . Alcohol use Yes     Allergies   Peanuts [peanut oil]   Review of Systems Review of Systems All other systems reviewed and are negative for acute change except as noted in the HPI.   Physical Exam Updated Vital Signs BP 138/73   Pulse (!) 113   Temp 98.8 F (37.1 C) (Oral)   Resp 20   Ht 5\' 11"  (1.803 m)   Wt 260 lb (  117.9 kg)   SpO2 100%   BMI 36.26 kg/m   Physical Exam  Constitutional: He is oriented to person, place, and time. He appears well-developed and well-nourished. No distress.  HENT:  Head: Normocephalic and atraumatic.  Nose: Nose normal.  Eyes: Conjunctivae and EOM are normal. Pupils are equal, round, and reactive to light. Right eye exhibits no discharge. Left eye exhibits no discharge. No scleral icterus.  Neck: Normal range of motion. Neck supple.  Cardiovascular: Regular rhythm.  Tachycardia present.   Exam reveals no gallop and no friction rub.   No murmur heard. Pulmonary/Chest: Accessory muscle usage present. No stridor. Tachypnea noted. He is in respiratory distress. He has wheezes. He has no rales.  Very poor air movement with faint and expiratory wheezing. Increased work of breathing with usage of accessory muscle groups. Unable to speak in full sentences.   Abdominal: Soft. He exhibits no distension. There is no tenderness.  Musculoskeletal: He exhibits no edema or tenderness.  Neurological: He is alert and oriented to person, place, and time.  Skin: Skin is warm and dry. No rash noted. He is not diaphoretic. No erythema.  Psychiatric: He has a normal mood and affect.  Vitals reviewed.  ED Treatments / Results  DIAGNOSTIC STUDIES: Oxygen Saturation is 94% on 3L via [place], normal by my interpretation.   COORDINATION OF CARE: 9:37 PM-Discussed next steps with pt including albuterol nebulizer, magnesium sulfate and possible intubation. Pt verbalized understanding and is agreeable with the plan.   Labs (all labs ordered are listed, but only abnormal results are displayed) Labs Reviewed  CBC WITH DIFFERENTIAL/PLATELET - Abnormal; Notable for the following:       Result Value   WBC 10.7 (*)    Lymphs Abs 4.5 (*)    Monocytes Absolute 1.1 (*)    Eosinophils Absolute 0.8 (*)    All other components within normal limits  COMPREHENSIVE METABOLIC PANEL - Abnormal; Notable for the following:    Glucose, Bld 101 (*)    Creatinine, Ser 1.51 (*)    GFR calc non Af Amer 58 (*)    All other components within normal limits  I-STAT ARTERIAL BLOOD GAS, ED - Abnormal; Notable for the following:    pH, Arterial 7.310 (*)    pCO2 arterial 51.4 (*)    pO2, Arterial 285.0 (*)    All other components within normal limits  BLOOD GAS, ARTERIAL    EKG  EKG Interpretation  Date/Time:  Friday Feb 20 2017 21:34:57 EDT Ventricular Rate:  124 PR Interval:    QRS Duration: 89 QT  Interval:  293 QTC Calculation: 421 R Axis:   88 Text Interpretation:  Sinus tachycardia Consider right atrial enlargement Artifact in lead(s) I II III aVR aVL aVF V1 V4 V5 V6 and baseline wander in lead(s) V2 V3 NO STEMI   Confirmed by [facility] MD, [place] Rollyson ([id number]) on 02/20/2017 11:51:18 PM       Radiology Dg Chest Portable 1 View  Result Date: 02/20/2017 CLINICAL DATA:  Acute onset of severe respiratory difficulty and generalized chest pain. Initial encounter. EXAM: PORTABLE CHEST 1 VIEW COMPARISON:  Chest radiograph performed 02/25/2015 FINDINGS: The lungs are well-aerated and clear. There is no evidence of focal opacification, pleural effusion or pneumothorax. The cardiomediastinal silhouette is within normal limits. No acute osseous abnormalities are seen. IMPRESSION: No acute cardiopulmonary process seen. Electronically Signed   By: Roanna Raider M.D.   On: 02/20/2017 21:59    Procedures Procedures   CRITICAL CARE  Performed by: Drema PryPedro Barrie Sigmund, MD Total critical care time: 50 minutes Critical care time was exclusive of separately billable procedures and treating other patients. Critical care was necessary to treat or prevent imminent or life-threatening deterioration. Critical care was time spent personally by me on the following activities: development of treatment plan with patient and/or surrogate as well as nursing, discussions with consultants, evaluation of patient's response to treatment, examination of patient, obtaining history from patient or surrogate, ordering and performing treatments and interventions, ordering and review of laboratory studies, ordering and review of radiographic studies, pulse oximetry and re-evaluation of patient's condition.   Medications Ordered in ED Medications  albuterol (PROVENTIL) (2.5 MG/3ML) 0.083% nebulizer solution 5 mg (0 mg Nebulization Canceled Entry 02/20/17 2136)  albuterol (PROVENTIL,VENTOLIN) solution continuous neb (10 mg/hr Nebulization  New Bag/Given 02/20/17 2145)  albuterol (PROVENTIL,VENTOLIN) solution continuous neb (not administered)  methylPREDNISolone sodium succinate (SOLU-MEDROL) 125 mg/2 mL injection (125 mg  Given 02/20/17 2142)  magnesium sulfate 2 GM/50ML IVPB (  Stopped 02/20/17 2225)   Initial Impression / Assessment and Plan / ED Course  I have reviewed the triage vital signs and the nursing notes.  Pertinent labs & imaging results that were available during my care of the patient were reviewed by me and considered in my medical decision making (see chart for details).     Severe asthma exacerbation. Patient given magnesium and Solu-Medrol. Continuous nebs started.   Patient became anxious and saturations dropped into the 70s. Patient was placed on BiPAP with improvement in his saturations, anxiety, and work of breathing.  In case of decompensation patient is a full code.  Chest x-ray obtained revealed no evidence of pneumonia. Gas after being on BiPAP revealed respiratory acidosis.  Discussed case with Dr. Maryfrances Bunnellanford who [facility].   Was informed that [facility] more [facility].  Spoke with Dr. Katrinka BlazingSmith who [facility].  Final Clinical Impressions(s) / ED Diagnoses   Final diagnoses:  Severe persistent asthma with exacerbation  Hypoxia  Respiratory acidosis   I personally performed the services described in this documentation, which was scribed in my presence. The recorded information has been reviewed and is accurate.        Nira Connardama, Taelor Waymire Eduardo, MD 02/20/17 2352

## 2017-02-20 NOTE — ED Notes (Signed)
Effect noted from Bipap therapy, breathing no longer labored, pt calmer, cooperative, o2 sat 100% on IPAP 16, EPAP 7, 100% O2.

## 2017-02-20 NOTE — Progress Notes (Signed)
23; 17 ABG done while on BiPAP  IPAP 16  EPAP 7  Ti 1.00, FIO2 60%

## 2017-02-20 NOTE — ED Notes (Signed)
Assumed care of patient from [place] - pt sitting at bedside, currently on CAT per RT - patient is in respiratory distress - labored breathing with tachypnea, states he cannot breath and albuterol ineffective, attempting to stand up, requesting to lay on floor - Dr. Eudelia Bunchardama called to bedside, RT at bedside.

## 2017-02-20 NOTE — ED Triage Notes (Addendum)
Patient was seen at an Urgent care this evening and given medications for his asthma. Patient presents to triage with increased work of breathing and SOB - room air sats at 86% - patient is tripoding with breathing.

## 2017-02-20 NOTE — ED Notes (Signed)
Bipap with CAT initiated by RT, EDP at bedside.

## 2017-02-20 NOTE — ED Notes (Signed)
Paged Hospitalist via [facility] @ 10:48 pm

## 2017-02-21 ENCOUNTER — Encounter (HOSPITAL_BASED_OUTPATIENT_CLINIC_OR_DEPARTMENT_OTHER): Payer: Self-pay

## 2017-02-21 DIAGNOSIS — I10 Essential (primary) hypertension: Secondary | ICD-10-CM

## 2017-02-21 DIAGNOSIS — N179 Acute kidney failure, unspecified: Secondary | ICD-10-CM

## 2017-02-21 DIAGNOSIS — J45902 Unspecified asthma with status asthmaticus: Secondary | ICD-10-CM | POA: Insufficient documentation

## 2017-02-21 DIAGNOSIS — J9621 Acute and chronic respiratory failure with hypoxia: Secondary | ICD-10-CM

## 2017-02-21 DIAGNOSIS — J4540 Moderate persistent asthma, uncomplicated: Secondary | ICD-10-CM | POA: Insufficient documentation

## 2017-02-21 LAB — RESPIRATORY PANEL BY PCR
ADENOVIRUS-RVPPCR: NOT DETECTED
BORDETELLA PERTUSSIS-RVPCR: NOT DETECTED
CHLAMYDOPHILA PNEUMONIAE-RVPPCR: NOT DETECTED
CORONAVIRUS 229E-RVPPCR: NOT DETECTED
CORONAVIRUS HKU1-RVPPCR: NOT DETECTED
Coronavirus NL63: NOT DETECTED
Coronavirus OC43: NOT DETECTED
Influenza A: NOT DETECTED
Influenza B: NOT DETECTED
MYCOPLASMA PNEUMONIAE-RVPPCR: NOT DETECTED
Metapneumovirus: NOT DETECTED
PARAINFLUENZA VIRUS 3-RVPPCR: NOT DETECTED
Parainfluenza Virus 1: NOT DETECTED
Parainfluenza Virus 2: NOT DETECTED
Parainfluenza Virus 4: NOT DETECTED
Respiratory Syncytial Virus: NOT DETECTED
Rhinovirus / Enterovirus: NOT DETECTED

## 2017-02-21 LAB — BASIC METABOLIC PANEL
ANION GAP: 9 (ref 5–15)
BUN: 19 mg/dL (ref 6–20)
CO2: 23 mmol/L (ref 22–32)
Calcium: 9 mg/dL (ref 8.9–10.3)
Chloride: 107 mmol/L (ref 101–111)
Creatinine, Ser: 1.53 mg/dL — ABNORMAL HIGH (ref 0.61–1.24)
GFR, EST NON AFRICAN AMERICAN: 57 mL/min — AB (ref >60)
GLUCOSE: 164 mg/dL — AB (ref 65–99)
Potassium: 5.8 mmol/L — ABNORMAL HIGH (ref 3.5–5.1)
Sodium: 139 mmol/L (ref 135–145)

## 2017-02-21 LAB — CBC
HEMATOCRIT: 46.6 % (ref 39.0–52.0)
Hemoglobin: 15.4 g/dL (ref 13.0–17.0)
MCH: 28.6 pg (ref 26.0–34.0)
MCHC: 33 g/dL (ref 30.0–36.0)
MCV: 86.5 fL (ref 78.0–100.0)
Platelets: 266 10*3/uL (ref 150–400)
RBC: 5.39 MIL/uL (ref 4.22–5.81)
RDW: 13.9 % (ref 11.5–15.5)
WBC: 12.3 10*3/uL — ABNORMAL HIGH (ref 4.0–10.5)

## 2017-02-21 LAB — BLOOD GAS, ARTERIAL
Acid-base deficit: 1.8 mmol/L (ref 0.0–2.0)
BICARBONATE: 22.9 mmol/L (ref 20.0–28.0)
DRAWN BY: 105521
O2 CONTENT: 2 L/min
O2 Saturation: 96.5 %
PATIENT TEMPERATURE: 98.6
PH ART: 7.359 (ref 7.350–7.450)
pCO2 arterial: 41.7 mmHg (ref 32.0–48.0)
pO2, Arterial: 92.4 mmHg (ref 83.0–108.0)

## 2017-02-21 LAB — MRSA PCR SCREENING: MRSA BY PCR: NEGATIVE

## 2017-02-21 MED ORDER — BUDESONIDE 0.5 MG/2ML IN SUSP
0.5000 mg | Freq: Two times a day (BID) | RESPIRATORY_TRACT | Status: DC
  Administered 2017-02-21 – 2017-02-22 (×3): 0.5 mg via RESPIRATORY_TRACT
  Filled 2017-02-21 (×3): qty 2

## 2017-02-21 MED ORDER — ACETAMINOPHEN 325 MG PO TABS: 650.0000 mg | ORAL_TABLET | Freq: Four times a day (QID) | ORAL | Status: DC | PRN

## 2017-02-21 MED ORDER — IPRATROPIUM-ALBUTEROL 0.5-2.5 (3) MG/3ML IN SOLN
3.0000 mL | Freq: Four times a day (QID) | RESPIRATORY_TRACT | Status: DC
  Administered 2017-02-21 (×4): 3 mL via RESPIRATORY_TRACT
  Filled 2017-02-21 (×4): qty 3

## 2017-02-21 MED ORDER — SODIUM CHLORIDE 0.9 % IV SOLN
INTRAVENOUS | Status: DC
  Administered 2017-02-21: 04:00:00 via INTRAVENOUS

## 2017-02-21 MED ORDER — DEXTROSE 5 % IV SOLN
1.0000 g | INTRAVENOUS | Status: DC
  Administered 2017-02-21 – 2017-02-22 (×2): 1 g via INTRAVENOUS
  Filled 2017-02-21 (×2): qty 10

## 2017-02-21 MED ORDER — ONDANSETRON HCL 4 MG/2ML IJ SOLN: 4.0000 mg | Freq: Four times a day (QID) | INTRAMUSCULAR | Status: DC | PRN

## 2017-02-21 MED ORDER — ARFORMOTEROL TARTRATE 15 MCG/2ML IN NEBU
15.0000 ug | INHALATION_SOLUTION | Freq: Two times a day (BID) | RESPIRATORY_TRACT | Status: DC
  Administered 2017-02-21 – 2017-02-22 (×3): 15 ug via RESPIRATORY_TRACT
  Filled 2017-02-21 (×3): qty 2

## 2017-02-21 MED ORDER — ONDANSETRON HCL 4 MG PO TABS: 4.0000 mg | ORAL_TABLET | Freq: Four times a day (QID) | ORAL | Status: DC | PRN

## 2017-02-21 MED ORDER — ALBUTEROL SULFATE (2.5 MG/3ML) 0.083% IN NEBU
5.0000 mg | INHALATION_SOLUTION | RESPIRATORY_TRACT | Status: DC | PRN
Start: 2017-02-21 — End: 2017-02-22
  Administered 2017-02-22: 5 mg via RESPIRATORY_TRACT
  Filled 2017-02-21 (×2): qty 6

## 2017-02-21 MED ORDER — AZITHROMYCIN 500 MG PO TABS
500.0000 mg | ORAL_TABLET | Freq: Every day | ORAL | Status: DC
  Administered 2017-02-21 – 2017-02-22 (×2): 500 mg via ORAL
  Filled 2017-02-21 (×2): qty 1

## 2017-02-21 MED ORDER — SODIUM POLYSTYRENE SULFONATE 15 GM/60ML PO SUSP
30.0000 g | Freq: Once | ORAL | Status: AC
  Administered 2017-02-21: 30 g via ORAL
  Filled 2017-02-21: qty 120

## 2017-02-21 MED ORDER — ENOXAPARIN SODIUM 40 MG/0.4ML ~~LOC~~ SOLN
40.0000 mg | SUBCUTANEOUS | Status: DC
  Administered 2017-02-21 – 2017-02-22 (×2): 40 mg via SUBCUTANEOUS
  Filled 2017-02-21 (×2): qty 0.4

## 2017-02-21 MED ORDER — MONTELUKAST SODIUM 10 MG PO TABS
10.0000 mg | ORAL_TABLET | Freq: Every day | ORAL | Status: DC
  Administered 2017-02-21: 10 mg via ORAL
  Filled 2017-02-21: qty 1

## 2017-02-21 MED ORDER — ACETAMINOPHEN 650 MG RE SUPP: 650.0000 mg | Freq: Four times a day (QID) | RECTAL | Status: DC | PRN

## 2017-02-21 MED ORDER — PREDNISONE 50 MG PO TABS
60.0000 mg | ORAL_TABLET | Freq: Every day | ORAL | Status: DC
  Administered 2017-02-21 – 2017-02-22 (×2): 60 mg via ORAL
  Filled 2017-02-21 (×2): qty 1

## 2017-02-21 NOTE — Progress Notes (Signed)
Took pt off Bipap and placed on 2L

## 2017-02-21 NOTE — Progress Notes (Signed)
Wardell Honourerrez Labree is a 36 y.o. male patient who transferred  from 4E awake, alert  & orientated  X 3, Full Code, VSS - Blood pressure 126/65, pulse 87, temperature 98.5 F (36.9 C), temperature source Oral, resp. rate 15, height 5\' 11"  (1.803 m), weight 119.6 kg (263 lb 11.2 oz), SpO2 94 %., R/A, no c/o shortness of breath, small amount of c/o chest pain, no distress noted. Tele # [id number] with cont. Pulse ox placed and pt is currently running:sinus tachycardia.   IV site WDL: antecubital  And forearm right, condition patent and no redness with a transparent dsg that's clean dry and intact.  Allergies:   Allergies  Allergen Reactions  . Peanuts [Peanut Oil] Shortness Of Breath     Past Medical History:  Diagnosis Date  . Asthma   . Hypertension   . OSA on CPAP     Pt orientation to unit, room and routine. SR up x 2, fall risk assessment complete with Patient and family verbalizing understanding of risks associated with falls. Pt verbalizes an understanding of how to use the call bell and to call for help before getting out of bed.  Skin, clean-dry- intact without evidence of bruising, or skin tears.   No evidence of skin break down noted on exam. no rashes    Will cont to monitor and assist as needed.  Joana ReamerJohnson, Lemonte Al C, RN 02/21/2017 5:44 PM4E

## 2017-02-21 NOTE — H&P (Signed)
History and Physical    Charles Collier ZOX:[id number] DOB: [birth year] DOA: 02/20/2017  Referring MD/NP/PA: Dr. Eudelia Bunch PCP: Eliott Nine, MD  Patient coming from: [facility] Transfer  Chief Complaint:    HPI: Charles Collier is a 36 y.o. male with medical history significant of asthma, HTN, and hypogonadism; who presented with complaints of acute shortness of breath. With the recent weather changes and increased pollen he patient reports having symptoms of chest tightness for the last week. He went to see his primary care provider as he normally  can receive a steroid injection to help relieve symptoms. However, following the injection he noted that his breathing became significantly worse with wheezing. He reports using his Advair as prescribed along with albuterol inhalers without relief of symptoms. Patient notes use of supplements for weight lifting including creatine. Denies any significant history of kidney problems previously in the past. Denies any fevers, chills, nausea, vomiting, abdominal pain, chest pain, diarrhea, or recent sick contacts. He has never required intubation or been hospitalized previously for his asthma.   ED Course: On admission into the emergency department patient was noted to be afebrile, heart rate is 85-126, respirations 10-38, blood pressure is elevated up to 175/134, and O2 saturations as low as 86% on room air. ABG revealed PCO2 of 51.4. Patient was placed on BiPAP and given hour long albuterol treatment. Transferred to the stepdown unit for further treatment and management.   Review of Systems: As per HPI otherwise 10 point review of systems negative.   Past Medical History:  Diagnosis Date  . Asthma   . Hypertension   . OSA on CPAP     Past Surgical History:  Procedure Laterality Date  . ANTERIOR CRUCIATE LIGAMENT REPAIR       reports that he has been smoking Cigarettes.  He has been smoking about 0.50 packs per day. He has never used smokeless  tobacco. He reports that he drinks alcohol. He reports that he does not use drugs.  Allergies  Allergen Reactions  . Peanuts [Peanut Oil] Shortness Of Breath    History reviewed. No pertinent family history.  Prior to Admission medications   Medication Sig Start Date End Date Taking? Authorizing Provider  albuterol (PROVENTIL HFA;VENTOLIN HFA) 108 (90 BASE) MCG/ACT inhaler Inhale 2 puffs into the lungs every 6 (six) hours as needed. Patient used this medication for wheezing and shortness of breath.    [provider]  albuterol (PROVENTIL) (2.5 MG/3ML) 0.083% nebulizer solution Take 3 mLs (2.5 mg total) by nebulization every 6 (six) hours as needed for wheezing or shortness of breath. 06/27/14   Jerelyn Scott, MD  cephALEXin (KEFLEX) 500 MG capsule Take 1 capsule (500 mg total) by mouth 4 (four) times daily. 09/29/16   Kirichenko, Tatyana, PA-C  LISINOPRIL PO Take by mouth.    [provider]  naproxen (NAPROSYN) 500 MG tablet Take 1 tablet (500 mg total) by mouth 2 (two) times daily. 09/29/16   Kirichenko, Lemont Fillers, PA-C  testosterone cypionate (DEPOTESTOSTERONE CYPIONATE) 200 MG/ML injection Inject into the muscle every 14 (fourteen) days.    [provider]    Physical Exam:   Constitutional: Overweight NAD, calm, comfortable Vitals:   02/21/17 0130 02/21/17 0145 02/21/17 0258 02/21/17 0312  BP: 121/60 103/65  134/76  Pulse: 90 91  94  Resp: 16 15  15   Temp:    98.5 F (36.9 C)  TempSrc:    Oral  SpO2: 99% 100% 100% 99%  Weight:    119.4  kg (263 lb 4.8 oz)  Height:    5\' 11"  (1.803 m)   Eyes: PERRL, lids and conjunctivae normal ENMT: Mucous membranes are moist. Posterior pharynx clear of any exudate or lesions.Normal dentition.  Neck: normal, supple, no masses, no thyromegaly Respiratory: Normal respiratory effort with mild expiratory wheezes appreciated on BiPAP. Cardiovascular: Regular rate and rhythm, no murmurs / rubs / gallops. No extremity  edema. 2+ pedal pulses. No carotid bruits.  Abdomen: no tenderness, no masses palpated. No hepatosplenomegaly. Bowel sounds positive.  Musculoskeletal: no clubbing / cyanosis. No joint deformity upper and lower extremities. Good ROM, no contractures. Normal muscle tone.  Skin: no rashes, lesions, ulcers. No induration Neurologic: CN 2-12 grossly intact. Sensation intact, DTR normal. Strength 5/5 in all 4.  Psychiatric: Normal judgment and insight. Alert and oriented x 3. Normal mood.     Labs on Admission: I have personally reviewed following labs and imaging studies  CBC:  Recent Labs Lab 02/20/17 2200  WBC 10.7*  NEUTROABS 4.2  HGB 16.1  HCT 46.9  MCV 84.1  PLT 296   Basic Metabolic Panel:  Recent Labs Lab 02/20/17 2200  NA 141  K 3.8  CL 107  CO2 26  GLUCOSE 101*  BUN 19  CREATININE 1.51*  CALCIUM 9.1   GFR: Estimated Creatinine Clearance: 88.9 mL/min (A) (by C-G formula based on SCr of 1.51 mg/dL (H)). Liver Function Tests:  Recent Labs Lab 02/20/17 2200  AST 35  ALT 34  ALKPHOS 54  BILITOT 0.5  PROT 7.7  ALBUMIN 4.4   No results for input(s): LIPASE, AMYLASE in the last 168 hours. No results for input(s): AMMONIA in the last 168 hours. Coagulation Profile: No results for input(s): INR, PROTIME in the last 168 hours. Cardiac Enzymes: No results for input(s): CKTOTAL, CKMB, CKMBINDEX, TROPONINI in the last 168 hours. BNP (last 3 results) No results for input(s): PROBNP in the last 8760 hours. HbA1C: No results for input(s): HGBA1C in the last 72 hours. CBG: No results for input(s): GLUCAP in the last 168 hours. Lipid Profile: No results for input(s): CHOL, HDL, LDLCALC, TRIG, CHOLHDL, LDLDIRECT in the last 72 hours. Thyroid Function Tests: No results for input(s): TSH, T4TOTAL, FREET4, T3FREE, THYROIDAB in the last 72 hours. Anemia Panel: No results for input(s): VITAMINB12, FOLATE, FERRITIN, TIBC, IRON, RETICCTPCT in the last 72 hours. Urine  analysis:    Component Value Date/Time   COLORURINE YELLOW 05/27/2013 1900   APPEARANCEUR CLEAR 05/27/2013 1900   LABSPEC 1.018 05/27/2013 1900   PHURINE 6.0 05/27/2013 1900   GLUCOSEU NEGATIVE 05/27/2013 1900   HGBUR NEGATIVE 05/27/2013 1900   BILIRUBINUR NEGATIVE 05/27/2013 1900   KETONESUR NEGATIVE 05/27/2013 1900   PROTEINUR NEGATIVE 05/27/2013 1900   UROBILINOGEN 0.2 05/27/2013 1900   NITRITE NEGATIVE 05/27/2013 1900   LEUKOCYTESUR NEGATIVE 05/27/2013 1900   Sepsis Labs: No results found for this or any previous visit (from the past 240 hour(s)).   Radiological Exams on Admission: Dg Chest Portable 1 View  Result Date: 02/20/2017 CLINICAL DATA:  Acute onset of severe respiratory difficulty and generalized chest pain. Initial encounter. EXAM: PORTABLE CHEST 1 VIEW COMPARISON:  Chest radiograph performed 02/25/2015 FINDINGS: The lungs are well-aerated and clear. There is no evidence of focal opacification, pleural effusion or pneumothorax. The cardiomediastinal silhouette is within normal limits. No acute osseous abnormalities are seen. IMPRESSION: No acute cardiopulmonary process seen. Electronically Signed   By: Roanna Raider M.D.   On: 02/20/2017 21:59    EKG: Independently  reviewed. Sinus tachycardia 124 bpm  Assessment/Plan Moderate persistent asthma with status asthmaticus: Acute. Patient noted to be hypoxic on room air requiring placement on BiPAP with significant improvement of symptoms. - Admit to stepdown - Continuous pulse oximetry with nasal cannula oxygen to keep O2 and 92%  - BiPAP - Monitor peak flows  - Check respiratory viral panel -  Duonebs QID and Albuterol nebs q 2hr prn SOB/Wheezing  - Brovana and budesonide nebs - Prednisone 60mg  po daily - Singulair   Acute on chronic respiratory failure with hypoxia [facility](HCC): Initial ABG revealing pH-7.31, PCO2-51.4, PO2 285 -  As seen above   Leukocytosis: WBC mildly elevated at 10.7 on admission. Chest x-ray shows  no acute signs of infiltrate.  - Recheck CBC  Acute kidney injury: Suspected baseline kidney function to be around 1.2. Patient presents with a creatinine 1.51. Note patient reports use of weight lifting supplements of creatine and likely not keeping himself hydrated. - IVF NS at 12200ml/hr - Recheck BMP - consider need of further workup if kidney function does not appear to be improving  Essential hypertension - Held lisinopril 2/2 possible AKI  DVT prophylaxis: Lovenox  Code Status: Full  Family Communication: The spine care with the patient Disposition Plan: Likely discharge home in 1-2 days  Consults called: None  Admission status:Inpatient   Charles Braunondell A Smith MD [facility] Pager [phone]- [phone]  If 7PM-7AM, please contact night-coverage [email or web address] Password [email or web address]  02/21/2017, 3:27 AM

## 2017-02-21 NOTE — Progress Notes (Addendum)
Patient seen and examined  36 y.o. male with medical history significant of asthma, HTN, and hypogonadism; who presented with complaints of acute shortness of breath. Admitted for asthma exacerbation. Initial ABG shows pH of 7.3, PCO2 51.  Assessment/Plan Moderate persistent asthma with status asthmaticus: Acute. Patient noted to be hypoxic on room air requiring placement on BiPAP with significant improvement of symptoms. Continue stepdown - Continuous pulse oximetry with nasal cannula oxygen to keep O2 and 92%  - weaned off  BiPAP  , uses cpap qhs  - Check respiratory viral panel -  Duonebs QID and Albuterol nebs q 2hr prn SOB/Wheezing  - Brovana and budesonide nebs - Prednisone 60mg  po daily - Singulair Start patient on azithromycin and Rocephin empirically  ABG 7.35 ,pco 2  41.7 tx to tele   Acute on chronic respiratory failure with hypoxia [facility](HCC):  Initial ABG revealing pH-7.31, PCO2-51.4, PO2 285   Leukocytosis: WBC mildly elevated at 10.7 on admission. Chest x-ray shows no acute signs of infiltrate.  - Recheck CBC  Acute kidney injury: Suspected baseline kidney function to be around 1.2. Patient presents with a creatinine 1.51. Note patient reports use of weight lifting supplements of creatine and likely not keeping himself hydrated. - IVF NS at 18000ml/hr - Recheck BMP    Essential hypertension - Held lisinopril 2/2 possible AKI  Hyperkalemia Will give one dose of kayexalate

## 2017-02-21 NOTE — ED Notes (Signed)
[facility], RN to assume care of patient at [facility]

## 2017-02-21 NOTE — Progress Notes (Signed)
RT NOTE:  CPAP setup @ bedside per MD order w/ home auto settings. No O2 needed. Humidity chamber filled with sterile water. Pt will put CPAP on when he is ready for bed.

## 2017-02-22 ENCOUNTER — Other Ambulatory Visit: Payer: Self-pay

## 2017-02-22 LAB — HIV ANTIBODY (ROUTINE TESTING W REFLEX): HIV Screen 4th Generation wRfx: NONREACTIVE

## 2017-02-22 LAB — CBC
HCT: 45.1 % (ref 39.0–52.0)
Hemoglobin: 15.1 g/dL (ref 13.0–17.0)
MCH: 29 pg (ref 26.0–34.0)
MCHC: 33.5 g/dL (ref 30.0–36.0)
MCV: 86.6 fL (ref 78.0–100.0)
PLATELETS: 268 10*3/uL (ref 150–400)
RBC: 5.21 MIL/uL (ref 4.22–5.81)
RDW: 14.4 % (ref 11.5–15.5)
WBC: 16.9 10*3/uL — AB (ref 4.0–10.5)

## 2017-02-22 LAB — COMPREHENSIVE METABOLIC PANEL
ALT: 31 U/L (ref 17–63)
AST: 31 U/L (ref 15–41)
Albumin: 3.8 g/dL (ref 3.5–5.0)
Alkaline Phosphatase: 47 U/L (ref 38–126)
Anion gap: 9 (ref 5–15)
BUN: 15 mg/dL (ref 6–20)
CHLORIDE: 105 mmol/L (ref 101–111)
CO2: 26 mmol/L (ref 22–32)
CREATININE: 1.37 mg/dL — AB (ref 0.61–1.24)
Calcium: 8.9 mg/dL (ref 8.9–10.3)
Glucose, Bld: 98 mg/dL (ref 65–99)
POTASSIUM: 3.8 mmol/L (ref 3.5–5.1)
SODIUM: 140 mmol/L (ref 135–145)
Total Bilirubin: 0.4 mg/dL (ref 0.3–1.2)
Total Protein: 6.9 g/dL (ref 6.5–8.1)

## 2017-02-22 LAB — TSH: TSH: 0.397 u[IU]/mL (ref 0.350–4.500)

## 2017-02-22 MED ORDER — LISINOPRIL 20 MG PO TABS: 20.0000 mg | ORAL_TABLET | Freq: Every day | ORAL | 0 refills | Status: DC

## 2017-02-22 MED ORDER — FLUTICASONE-SALMETEROL 250-50 MCG/DOSE IN AEPB: 1.0000 | INHALATION_SPRAY | Freq: Two times a day (BID) | RESPIRATORY_TRACT | 1 refills | Status: DC

## 2017-02-22 MED ORDER — MOXIFLOXACIN HCL 400 MG PO TABS: 400.0000 mg | ORAL_TABLET | Freq: Every day | ORAL | 0 refills | Status: AC

## 2017-02-22 MED ORDER — ALBUTEROL SULFATE (2.5 MG/3ML) 0.083% IN NEBU: 2.5000 mg | INHALATION_SOLUTION | Freq: Four times a day (QID) | RESPIRATORY_TRACT | 2 refills | Status: DC | PRN

## 2017-02-22 MED ORDER — AZITHROMYCIN 500 MG PO TABS: 500.0000 mg | ORAL_TABLET | Freq: Every day | ORAL | 0 refills | Status: DC

## 2017-02-22 MED ORDER — PREDNISONE 20 MG PO TABS: ORAL_TABLET | ORAL | 0 refills | Status: DC

## 2017-02-22 NOTE — Discharge Summary (Addendum)
Physician Discharge Summary  Charles Collier MRN: [id number] DOB/AGE: 1981/04/28 36 y.o.  PCP: Daphene Calamity, MD   Admit date: 02/20/2017 Discharge date: 02/22/2017  Discharge Diagnoses:    Principal Problem:   Moderate persistent asthma with status asthmaticus Active Problems:   Essential hypertension   Acute on chronic respiratory failure with hypoxia (HCC)    Follow-up recommendations Follow-up with PCP in 3-5 days , including all  additional recommended appointments as below Follow-up CBC, CMP in 3-5 days PCP to follow renal function closely      Current Discharge Medication List    START taking these medications   Details  moxifloxacin (AVELOX) 400 MG tablet Take 1 tablet (400 mg total) by mouth daily. Qty: 7 tablet, Refills: 0      CONTINUE these medications which have CHANGED   Details  albuterol (PROVENTIL) (2.5 MG/3ML) 0.083% nebulizer solution Take 3 mLs (2.5 mg total) by nebulization every 6 (six) hours as needed for wheezing or shortness of breath. Qty: 75 mL, Refills: 2      Take 1 tablet (500 mg total) by mouth daily. Qty: 5 tablet, Refills: 0    Fluticasone-Salmeterol (ADVAIR) 250-50 MCG/DOSE AEPB Inhale 1 puff into the lungs 2 (two) times daily. Qty: 60 each, Refills: 1    predniSONE (DELTASONE) 20 MG tablet 3 tablets 4 days, 2 tablets 4 days, 1 tablet 4 days, half tablet 4 days, then discontinue Qty: 80 tablet, Refills: 0      CONTINUE these medications which have NOT CHANGED   Details  lisinopril (PRINIVIL,ZESTRIL) 20 MG tablet Take 20 mg by mouth daily.    montelukast (SINGULAIR) 10 MG tablet Take 10 mg by mouth at bedtime.    testosterone cypionate (DEPOTESTOSTERONE CYPIONATE) 200 MG/ML injection Inject into the muscle every 14 (fourteen) days.    albuterol (PROVENTIL HFA;VENTOLIN HFA) 108 (90 BASE) MCG/ACT inhaler Inhale 2 puffs into the lungs every 6 (six) hours as needed. Patient used this medication for wheezing and  shortness of breath.         Discharge Condition:  Stable   Discharge Instructions Get Medicines reviewed and adjusted: Please take all your medications with you for your next visit with your Primary MD  Please request your Primary MD to go over all hospital tests and procedure/radiological results at the follow up, please ask your Primary MD to get all Hospital records sent to his/her office.  If you experience worsening of your admission symptoms, develop shortness of breath, life threatening emergency, suicidal or homicidal thoughts you must seek medical attention immediately by calling 911 or calling your MD immediately if symptoms less severe.  You must read complete instructions/literature along with all the possible adverse reactions/side effects for all the Medicines you take and that have been prescribed to you. Take any new Medicines after you have completely understood and accpet all the possible adverse reactions/side effects.   Do not drive when taking Pain medications.   Do not take more than prescribed Pain, Sleep and Anxiety Medications  Special Instructions: If you have smoked or chewed Tobacco in the last 2 yrs please stop smoking, stop any regular Alcohol and or any Recreational drug use.  Wear Seat belts while driving.  Please note  You were cared for by a hospitalist during your hospital stay. Once you are discharged, your primary care physician will handle any further medical issues. Please note that NO REFILLS for any discharge medications will be authorized once you are discharged, as it is imperative that  you return to your primary care physician (or establish a relationship with a primary care physician if you do not have one) for your aftercare needs so that they can reassess your need for medications and monitor your lab values.     Allergies  Allergen Reactions  . Peanuts [Peanut Oil] Shortness Of Breath      Disposition: 01-Home or Self  Care   Consults: * None  Significant Diagnostic Studies:  Dg Chest Portable 1 View  Result Date: 02/20/2017 CLINICAL DATA:  Acute onset of severe respiratory difficulty and generalized chest pain. Initial encounter. EXAM: PORTABLE CHEST 1 VIEW COMPARISON:  Chest radiograph performed 02/25/2015 FINDINGS: The lungs are well-aerated and clear. There is no evidence of focal opacification, pleural effusion or pneumothorax. The cardiomediastinal silhouette is within normal limits. No acute osseous abnormalities are seen. IMPRESSION: No acute cardiopulmonary process seen. Electronically Signed   By: Garald Balding M.D.   On: 02/20/2017 21:59       Filed Weights   02/20/17 2126 02/21/17 0312 02/21/17 1723  Weight: 117.9 kg (260 lb) 119.4 kg (263 lb 4.8 oz) 119.6 kg (263 lb 11.2 oz)     Microbiology: Recent Results (from the past 240 hour(s))  MRSA PCR Screening     Status: None   Collection Time: 02/21/17  2:55 AM  Result Value Ref Range Status   MRSA by PCR NEGATIVE NEGATIVE Final    Comment:        The GeneXpert MRSA Assay (FDA approved for NASAL specimens only), is one component of a comprehensive MRSA colonization surveillance program. It is not intended to diagnose MRSA infection nor to guide or monitor treatment for MRSA infections.   Respiratory Panel by PCR     Status: None   Collection Time: 02/21/17  4:08 AM  Result Value Ref Range Status   Adenovirus NOT DETECTED NOT DETECTED Final   Coronavirus 229E NOT DETECTED NOT DETECTED Final   Coronavirus HKU1 NOT DETECTED NOT DETECTED Final   Coronavirus NL63 NOT DETECTED NOT DETECTED Final   Coronavirus OC43 NOT DETECTED NOT DETECTED Final   Metapneumovirus NOT DETECTED NOT DETECTED Final   Rhinovirus / Enterovirus NOT DETECTED NOT DETECTED Final   Influenza A NOT DETECTED NOT DETECTED Final   Influenza B NOT DETECTED NOT DETECTED Final   Parainfluenza Virus 1 NOT DETECTED NOT DETECTED Final   Parainfluenza Virus 2 NOT  DETECTED NOT DETECTED Final   Parainfluenza Virus 3 NOT DETECTED NOT DETECTED Final   Parainfluenza Virus 4 NOT DETECTED NOT DETECTED Final   Respiratory Syncytial Virus NOT DETECTED NOT DETECTED Final   Bordetella pertussis NOT DETECTED NOT DETECTED Final   Chlamydophila pneumoniae NOT DETECTED NOT DETECTED Final   Mycoplasma pneumoniae NOT DETECTED NOT DETECTED Final       Blood Culture    Component Value Date/Time   SDES THIGH RIGHT 04/12/2012 2334   SPECREQUEST Normal 04/12/2012 2334   CULT  04/12/2012 2334    MODERATE METHICILLIN RESISTANT STAPHYLOCOCCUS AUREUS Note: RIFAMPIN AND GENTAMICIN SHOULD NOT BE USED AS SINGLE DRUGS FOR TREATMENT OF STAPH INFECTIONS. This organism DOES NOT demonstrate inducible Clindamycin resistance in vitro.   REPTSTATUS 04/15/2012 FINAL 04/12/2012 2334      Labs: Results for orders placed or performed during the hospital encounter of 02/20/17 (from the past 48 hour(s))  CBC with Differential     Status: Abnormal   Collection Time: 02/20/17 10:00 PM  Result Value Ref Range   WBC 10.7 (H) 4.0 - 10.5  K/uL   RBC 5.58 4.22 - 5.81 MIL/uL   Hemoglobin 16.1 13.0 - 17.0 g/dL   HCT 46.9 39.0 - 52.0 %   MCV 84.1 78.0 - 100.0 fL   MCH 28.9 26.0 - 34.0 pg   MCHC 34.3 30.0 - 36.0 g/dL   RDW 14.1 11.5 - 15.5 %   Platelets 296 150 - 400 K/uL   Neutrophils Relative % 40 %   Neutro Abs 4.2 1.7 - 7.7 K/uL   Lymphocytes Relative 42 %   Lymphs Abs 4.5 (H) 0.7 - 4.0 K/uL   Monocytes Relative 11 %   Monocytes Absolute 1.1 (H) 0.1 - 1.0 K/uL   Eosinophils Relative 7 %   Eosinophils Absolute 0.8 (H) 0.0 - 0.7 K/uL   Basophils Relative 0 %   Basophils Absolute 0.0 0.0 - 0.1 K/uL  Comprehensive metabolic panel     Status: Abnormal   Collection Time: 02/20/17 10:00 PM  Result Value Ref Range   Sodium 141 135 - 145 mmol/L   Potassium 3.8 3.5 - 5.1 mmol/L   Chloride 107 101 - 111 mmol/L   CO2 26 22 - 32 mmol/L   Glucose, Bld 101 (H) 65 - 99 mg/dL   BUN 19 6  - 20 mg/dL   Creatinine, Ser 1.51 (H) 0.61 - 1.24 mg/dL   Calcium 9.1 8.9 - 10.3 mg/dL   Total Protein 7.7 6.5 - 8.1 g/dL   Albumin 4.4 3.5 - 5.0 g/dL   AST 35 15 - 41 U/L   ALT 34 17 - 63 U/L   Alkaline Phosphatase 54 38 - 126 U/L   Total Bilirubin 0.5 0.3 - 1.2 mg/dL   GFR calc non Af Amer 58 (L) >60 mL/min   GFR calc Af Amer >60 >60 mL/min    Comment: (NOTE) The eGFR has been calculated using the CKD EPI equation. This calculation has not been validated in all clinical situations. eGFR's persistently <60 mL/min signify possible Chronic Kidney Disease.    Anion gap 8 5 - 15  I-Stat arterial blood gas, ED     Status: Abnormal   Collection Time: 02/20/17 11:18 PM  Result Value Ref Range   pH, Arterial 7.310 (L) 7.350 - 7.450   pCO2 arterial 51.4 (H) 32.0 - 48.0 mmHg   pO2, Arterial 285.0 (H) 83.0 - 108.0 mmHg   Bicarbonate 25.8 20.0 - 28.0 mmol/L   TCO2 27 0 - 100 mmol/L   O2 Saturation 100.0 %   Acid-base deficit 1.0 0.0 - 2.0 mmol/L   Patient temperature 98.8 F    Collection site RADIAL, ALLEN'S TEST ACCEPTABLE    Drawn by RT    Sample type ARTERIAL   MRSA PCR Screening     Status: None   Collection Time: 02/21/17  2:55 AM  Result Value Ref Range   MRSA by PCR NEGATIVE NEGATIVE    Comment:        The GeneXpert MRSA Assay (FDA approved for NASAL specimens only), is one component of a comprehensive MRSA colonization surveillance program. It is not intended to diagnose MRSA infection nor to guide or monitor treatment for MRSA infections.   Respiratory Panel by PCR     Status: None   Collection Time: 02/21/17  4:08 AM  Result Value Ref Range   Adenovirus NOT DETECTED NOT DETECTED   Coronavirus 229E NOT DETECTED NOT DETECTED   Coronavirus HKU1 NOT DETECTED NOT DETECTED   Coronavirus NL63 NOT DETECTED NOT DETECTED   Coronavirus OC43 NOT  DETECTED NOT DETECTED   Metapneumovirus NOT DETECTED NOT DETECTED   Rhinovirus / Enterovirus NOT DETECTED NOT DETECTED    Influenza A NOT DETECTED NOT DETECTED   Influenza B NOT DETECTED NOT DETECTED   Parainfluenza Virus 1 NOT DETECTED NOT DETECTED   Parainfluenza Virus 2 NOT DETECTED NOT DETECTED   Parainfluenza Virus 3 NOT DETECTED NOT DETECTED   Parainfluenza Virus 4 NOT DETECTED NOT DETECTED   Respiratory Syncytial Virus NOT DETECTED NOT DETECTED   Bordetella pertussis NOT DETECTED NOT DETECTED   Chlamydophila pneumoniae NOT DETECTED NOT DETECTED   Mycoplasma pneumoniae NOT DETECTED NOT DETECTED  CBC     Status: Abnormal   Collection Time: 02/21/17  5:57 AM  Result Value Ref Range   WBC 12.3 (H) 4.0 - 10.5 K/uL   RBC 5.39 4.22 - 5.81 MIL/uL   Hemoglobin 15.4 13.0 - 17.0 g/dL   HCT 46.6 39.0 - 52.0 %   MCV 86.5 78.0 - 100.0 fL   MCH 28.6 26.0 - 34.0 pg   MCHC 33.0 30.0 - 36.0 g/dL   RDW 13.9 11.5 - 15.5 %   Platelets 266 150 - 400 K/uL  Basic metabolic panel     Status: Abnormal   Collection Time: 02/21/17  5:57 AM  Result Value Ref Range   Sodium 139 135 - 145 mmol/L   Potassium 5.8 (H) 3.5 - 5.1 mmol/L   Chloride 107 101 - 111 mmol/L   CO2 23 22 - 32 mmol/L   Glucose, Bld 164 (H) 65 - 99 mg/dL   BUN 19 6 - 20 mg/dL   Creatinine, Ser 1.53 (H) 0.61 - 1.24 mg/dL   Calcium 9.0 8.9 - 10.3 mg/dL   GFR calc non Af Amer 57 (L) >60 mL/min   GFR calc Af Amer >60 >60 mL/min    Comment: (NOTE) The eGFR has been calculated using the CKD EPI equation. This calculation has not been validated in all clinical situations. eGFR's persistently <60 mL/min signify possible Chronic Kidney Disease.    Anion gap 9 5 - 15  Blood gas, arterial     Status: None   Collection Time: 02/21/17 11:07 AM  Result Value Ref Range   O2 Content 2.0 L/min   pH, Arterial 7.359 7.350 - 7.450   pCO2 arterial 41.7 32.0 - 48.0 mmHg   pO2, Arterial 92.4 83.0 - 108.0 mmHg   Bicarbonate 22.9 20.0 - 28.0 mmol/L   Acid-base deficit 1.8 0.0 - 2.0 mmol/L   O2 Saturation 96.5 %   Patient temperature 98.6    Collection site RIGHT  RADIAL    Drawn by [id number]    Sample type ARTERIAL DRAW    Allens test (pass/fail) PASS PASS  CBC     Status: Abnormal   Collection Time: 02/22/17  7:43 AM  Result Value Ref Range   WBC 16.9 (H) 4.0 - 10.5 K/uL   RBC 5.21 4.22 - 5.81 MIL/uL   Hemoglobin 15.1 13.0 - 17.0 g/dL   HCT 45.1 39.0 - 52.0 %   MCV 86.6 78.0 - 100.0 fL   MCH 29.0 26.0 - 34.0 pg   MCHC 33.5 30.0 - 36.0 g/dL   RDW 14.4 11.5 - 15.5 %   Platelets 268 150 - 400 K/uL  Comprehensive metabolic panel     Status: Abnormal   Collection Time: 02/22/17  7:43 AM  Result Value Ref Range   Sodium 140 135 - 145 mmol/L   Potassium 3.8 3.5 - 5.1 mmol/L  Chloride 105 101 - 111 mmol/L   CO2 26 22 - 32 mmol/L   Glucose, Bld 98 65 - 99 mg/dL   BUN 15 6 - 20 mg/dL   Creatinine, Ser 1.37 (H) 0.61 - 1.24 mg/dL   Calcium 8.9 8.9 - 10.3 mg/dL   Total Protein 6.9 6.5 - 8.1 g/dL   Albumin 3.8 3.5 - 5.0 g/dL   AST 31 15 - 41 U/L   ALT 31 17 - 63 U/L   Alkaline Phosphatase 47 38 - 126 U/L   Total Bilirubin 0.4 0.3 - 1.2 mg/dL   GFR calc non Af Amer >60 >60 mL/min   GFR calc Af Amer >60 >60 mL/min    Comment: (NOTE) The eGFR has been calculated using the CKD EPI equation. This calculation has not been validated in all clinical situations. eGFR's persistently <60 mL/min signify possible Chronic Kidney Disease.    Anion gap 9 5 - 15  TSH     Status: None   Collection Time: 02/22/17  7:43 AM  Result Value Ref Range   TSH 0.397 0.350 - 4.500 uIU/mL    Comment: Performed by a 3rd Generation assay with a functional sensitivity of <=0.01 uIU/mL.       HPI :  Charles Collier is a 36 y.o. male with medical history significant of asthma, HTN, and hypogonadism; who presented with complaints of acute shortness of breath. With the recent weather changes and increased pollen he patient reports having symptoms of chest tightness for the last week. He went to see his primary care provider as he normally  can receive a steroid injection to  help relieve symptoms. However, following the injection he noted that his breathing became significantly worse with wheezing. He reports using his Advair as prescribed along with albuterol inhalers without relief of symptoms. Patient notes use of supplements for weight lifting including creatine. Denies any significant history of kidney problems previously in the past. Denies any fevers, chills, nausea, vomiting, abdominal pain, chest pain, diarrhea, or recent sick contacts. He has never required intubation or been hospitalized previously for his asthma.   ED Course: On admission into the emergency department patient was noted to be afebrile, heart rate is 85-126, respirations 10-38, blood pressure is elevated up to 175/134, and O2 saturations as low as 86% on room air. ABG revealed PCO2 of 51.4. Patient was placed on BiPAP and given hour long albuterol treatment. Transferred to the stepdown unit for further treatment and management.   HOSPITAL COURSE:   Moderate persistent asthma with status asthmaticus: Acute. Patient noted to be hypoxic on room air requiring placement on BiPAP with significant improvement of symptoms. Initially placed in step down Weaned to room air - weaned off  BiPAP  , uses cpap qhs  Negative respiratory viral panel - Duonebs QID and Albuterol nebs q 2hr prn SOB/Wheezing  - Brovana and budesonide nebs -Now switched to prednisone taper - Continue Singulair, nebs Ambulatory pulse oximetry stable prior to discharge  Acute on chronic respiratory failure with hypoxia [facility]):  Initial ABG revealing pH-7.31, PCO2-51.4, PO2 285, subsequent ABG showed improvement of BiPAP   Leukocytosis: WBC mildly elevated at 10.7 on admission. Chest x-ray shows no acute signs of infiltrate.  Empirically started on antibiotics, now switched to Avelox, continue for another 7 days  Acute kidney injury: Suspected baseline kidney function to be around 1.2. Patient presents with a creatinine 1.51.  Note patient reports use of weight lifting supplements of creatine and likely not keeping  himself hydrated. Renal function improved, creatinine down to 1.37 prior to discharge    Essential hypertension - Held lisinopril 2/2 possible AKI, hold lisinopril for another 3-5 days  Hyperkalemia Status post receiving Kayexalate, potassium 3.8   Discharge Exam:   Blood pressure 121/64, pulse 68, temperature 98 F (36.7 C), temperature source Oral, resp. rate 18, height '5\' 11"'$  (1.803 m), weight 119.6 kg (263 lb 11.2 oz), SpO2 100 %.  Respiratory: Normal respiratory effort with mild expiratory wheezes  Cardiovascular: Regular rate and rhythm, no murmurs / rubs / gallops. No extremity edema. 2+ pedal pulses. No carotid bruits.  Abdomen: no tenderness, no masses palpated. No hepatosplenomegaly. Bowel sounds positive.  Musculoskeletal: no clubbing / cyanosis. No joint deformity upper and lower extremities. Good ROM, no contractures. Normal muscle tone.  Skin: no rashes, lesions, ulcers. No induration Neurologic: CN 2-12 grossly intact. Sensation intact, DTR normal. Strength 5/5 in all 4.  Psychiatric: Normal judgment and insight. Alert and oriented x 3. Normal mood    Follow-up Information    Daphene Calamity, MD. Call.   Specialty:  Family Medicine Why:  Hospital follow-up in 3-5 days Contact information: [place] [address] [phone]           Signed: Reyne Dumas 02/22/2017, 10:07 AM        Time spent >45 mins

## 2017-02-22 NOTE — Progress Notes (Signed)
Pt given discharge instructions, prescriptions, and care notes. Pt verbalized understanding AEB no further questions or concerns at this time. IV was discontinued, no redness, pain, or swelling noted at this time. Telemetry discontinued and Centralized Telemetry was notified. Pt left the floor in stable condition. 

## 2017-02-22 NOTE — Progress Notes (Signed)
SATURATION QUALIFICATIONS: (This note is used to comply with regulatory documentation for home oxygen)  Patient Saturations on Room Air at Rest = 96%  Patient Saturations on Room Air while Ambulating = 97%   Please briefly explain why patient needs home oxygen: N/A

## 2017-02-22 NOTE — Progress Notes (Signed)
Per CCMDPatient hasbeen running bradycardia lowest being 45. DR Teddy SpikeKarkakandy has been notified and he placed order for the metabolic panel.will continue to monitor  and inform as needed.

## 2017-03-07 DIAGNOSIS — R300 Dysuria: Secondary | ICD-10-CM | POA: Diagnosis not present

## 2017-03-11 DIAGNOSIS — L739 Follicular disorder, unspecified: Secondary | ICD-10-CM | POA: Diagnosis not present

## 2017-04-27 DIAGNOSIS — R7989 Other specified abnormal findings of blood chemistry: Secondary | ICD-10-CM | POA: Diagnosis not present

## 2017-04-27 DIAGNOSIS — J4530 Mild persistent asthma, uncomplicated: Secondary | ICD-10-CM | POA: Diagnosis not present

## 2017-04-27 DIAGNOSIS — I10 Essential (primary) hypertension: Secondary | ICD-10-CM | POA: Diagnosis not present

## 2017-06-22 DIAGNOSIS — G4733 Obstructive sleep apnea (adult) (pediatric): Secondary | ICD-10-CM | POA: Diagnosis not present

## 2017-06-22 DIAGNOSIS — J4530 Mild persistent asthma, uncomplicated: Secondary | ICD-10-CM | POA: Diagnosis not present

## 2017-06-22 DIAGNOSIS — I10 Essential (primary) hypertension: Secondary | ICD-10-CM | POA: Diagnosis not present

## 2017-07-07 DIAGNOSIS — M25562 Pain in left knee: Secondary | ICD-10-CM | POA: Diagnosis not present

## 2017-07-07 DIAGNOSIS — Z23 Encounter for immunization: Secondary | ICD-10-CM | POA: Diagnosis not present

## 2017-07-07 DIAGNOSIS — J4540 Moderate persistent asthma, uncomplicated: Secondary | ICD-10-CM | POA: Diagnosis not present

## 2017-07-07 DIAGNOSIS — N182 Chronic kidney disease, stage 2 (mild): Secondary | ICD-10-CM | POA: Diagnosis not present

## 2017-08-06 DIAGNOSIS — I10 Essential (primary) hypertension: Secondary | ICD-10-CM | POA: Diagnosis not present

## 2017-08-06 DIAGNOSIS — N182 Chronic kidney disease, stage 2 (mild): Secondary | ICD-10-CM | POA: Diagnosis not present

## 2017-08-06 DIAGNOSIS — J4540 Moderate persistent asthma, uncomplicated: Secondary | ICD-10-CM | POA: Diagnosis not present

## 2017-08-18 ENCOUNTER — Ambulatory Visit
Admission: RE | Admit: 2017-08-18 | Discharge: 2017-08-18 | Disposition: A | Discharge status: Home / Self Care | Payer: 59 | Source: Ambulatory Visit | Attending: Family Medicine | Admitting: Family Medicine

## 2017-08-18 ENCOUNTER — Other Ambulatory Visit: Payer: Self-pay | Admitting: Family Medicine

## 2017-08-18 DIAGNOSIS — M25561 Pain in right knee: Secondary | ICD-10-CM

## 2017-08-18 DIAGNOSIS — M179 Osteoarthritis of knee, unspecified: Secondary | ICD-10-CM | POA: Diagnosis not present

## 2017-08-18 DIAGNOSIS — M170 Bilateral primary osteoarthritis of knee: Secondary | ICD-10-CM | POA: Diagnosis not present

## 2017-08-18 DIAGNOSIS — M25562 Pain in left knee: Principal | ICD-10-CM

## 2017-08-25 ENCOUNTER — Other Ambulatory Visit: Payer: Self-pay | Admitting: Family Medicine

## 2017-08-25 DIAGNOSIS — Z9889 Other specified postprocedural states: Secondary | ICD-10-CM

## 2017-08-25 DIAGNOSIS — M25561 Pain in right knee: Secondary | ICD-10-CM

## 2017-08-25 DIAGNOSIS — G8929 Other chronic pain: Secondary | ICD-10-CM

## 2017-08-27 DIAGNOSIS — G4733 Obstructive sleep apnea (adult) (pediatric): Secondary | ICD-10-CM | POA: Diagnosis not present

## 2017-08-30 ENCOUNTER — Other Ambulatory Visit: Payer: 59

## 2017-09-01 DIAGNOSIS — G4733 Obstructive sleep apnea (adult) (pediatric): Secondary | ICD-10-CM | POA: Diagnosis not present

## 2017-10-20 DIAGNOSIS — E291 Testicular hypofunction: Secondary | ICD-10-CM | POA: Diagnosis not present

## 2017-11-10 DIAGNOSIS — E291 Testicular hypofunction: Secondary | ICD-10-CM | POA: Diagnosis not present

## 2018-01-26 ENCOUNTER — Other Ambulatory Visit (INDEPENDENT_AMBULATORY_CARE_PROVIDER_SITE_OTHER): Payer: 59

## 2018-01-26 ENCOUNTER — Encounter: Payer: Self-pay | Admitting: Internal Medicine

## 2018-01-26 ENCOUNTER — Ambulatory Visit: Payer: 59 | Admitting: Internal Medicine

## 2018-01-26 VITALS — BP 162/88 | HR 90 | Ht 71.0 in | Wt 296.4 lb

## 2018-01-26 DIAGNOSIS — J4530 Mild persistent asthma, uncomplicated: Secondary | ICD-10-CM | POA: Insufficient documentation

## 2018-01-26 DIAGNOSIS — I10 Essential (primary) hypertension: Secondary | ICD-10-CM | POA: Diagnosis not present

## 2018-01-26 LAB — CBC WITH DIFFERENTIAL/PLATELET
BASOS ABS: 0.1 10*3/uL (ref 0.0–0.1)
Basophils Relative: 0.9 % (ref 0.0–3.0)
Eosinophils Absolute: 0.9 10*3/uL — ABNORMAL HIGH (ref 0.0–0.7)
Eosinophils Relative: 8.3 % — ABNORMAL HIGH (ref 0.0–5.0)
HEMATOCRIT: 48 % (ref 39.0–52.0)
HEMOGLOBIN: 15.9 g/dL (ref 13.0–17.0)
LYMPHS PCT: 38 % (ref 12.0–46.0)
Lymphs Abs: 3.9 10*3/uL (ref 0.7–4.0)
MCHC: 33.2 g/dL (ref 30.0–36.0)
MCV: 85.1 fl (ref 78.0–100.0)
Monocytes Absolute: 0.9 10*3/uL (ref 0.1–1.0)
Monocytes Relative: 8.4 % (ref 3.0–12.0)
Neutro Abs: 4.6 10*3/uL (ref 1.4–7.7)
Neutrophils Relative %: 44.4 % (ref 43.0–77.0)
Platelets: 260 10*3/uL (ref 150.0–400.0)
RBC: 5.64 Mil/uL (ref 4.22–5.81)
RDW: 15.2 % (ref 11.5–15.5)
WBC: 10.4 10*3/uL (ref 4.0–10.5)

## 2018-01-26 LAB — NITRIC OXIDE: NITRIC OXIDE: 29

## 2018-01-26 MED ORDER — BUDESONIDE-FORMOTEROL FUMARATE 80-4.5 MCG/ACT IN AERO: INHALATION_SPRAY | RESPIRATORY_TRACT | 12 refills | Status: DC

## 2018-01-26 MED ORDER — TELMISARTAN 80 MG PO TABS: 80.0000 mg | ORAL_TABLET | Freq: Every day | ORAL | 11 refills | Status: DC

## 2018-01-26 NOTE — Assessment & Plan Note (Signed)
In the best review of chronic cough to date ( NEJM 2016 375 [id number]) ,  ACEi are now felt to cause cough in up to  20% of pts which is a 4 fold increase from previous reports and does not include the variety of non-specific complaints we see in pulmonary clinic in pts on ACEi but previously attributed to another dx like  Copd/asthma and  include PNDS, throat and chest congestion, "bronchitis", unexplained dyspnea and noct "strangling" sensations, and hoarseness, but also  atypical /refractory GERD symptoms like dysphagia and "bad heartburn"   The only way I know  to prove this is not an "ACEi Case" is a trial off ACEi x a minimum of 6 weeks then regroup.   Try micardis 80 mg one daily and return in 4 weeks for full pfts

## 2018-01-26 NOTE — Patient Instructions (Addendum)
Stop lisinopril and start micardis 80 mg one daily in its place and your breathing should gradually normalize   Plan A = Automatic =  Symbicort 80 Take 2 puffs first thing in am and then another 2 puffs about 12 hours later.   Work on inhaler technique:  relax and gently blow all the way out then take a nice smooth deep breath back in, triggering the inhaler at same time you start breathing in.  Hold for up to 5 seconds if you can. Blow out thru nose. Rinse and gargle with water when done      Plan B = Backup Only use your albuterol as a rescue medication to be used if you can't catch your breath by resting or doing a relaxed purse lip breathing pattern.  - The less you use it, the better it will work when you need it. - Ok to use the inhaler up to 2 puffs  every 4 hours if you must but call for appointment if use goes up over your usual need - Don't leave home without it !!  (think of it like the spare tire for your car)   Plan C = Crisis - only use your albuterol nebulizer if you first try Plan B and it fails to help > ok to use the nebulizer up to every 4 hours but if start needing it regularly call for immediate appointment   Please remember to go to the lab department downstairs in the basement  for your tests - we will call you with the results when they are available.      Please schedule a follow up office visit in 4 weeks, sooner if needed with pfts on return (hold all your inhalers that day if possible)

## 2018-01-26 NOTE — Progress Notes (Signed)
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 Pulmonary Consult    Self referral for Asthma. He states he was dxed with Asthma age 37. He states that his breathing has been worse "for years" and also c/o occ chest tightness.  He uses his albuterol inhaler 3-4 x per day.  He uses his neb approx 2 x per wk.  He uses CPAP. He occ wakes up in the night and has to use his albuterol.     had not used saba  Prior to ov / cough is mucoid/ gen chest tightness even when not actively wheezing   No obvious day to day or daytime variability or assoc excess/ purulent sputum or mucus plugs or hemoptysis or cp  or overt sinus or hb symptoms. No unusual exposure hx or h/o childhood pna  or knowledge of premature birth.   Also denies any obvious fluctuation of symptoms with weather or environmental changes or other aggravating or alleviating factors except as outlined above   Current Allergies, Complete Past Medical History, Past Surgical History, Family History, and Social History were reviewed in [employer] record.  ROS  The following are not active complaints unless bolded Hoarseness, sore throat, dysphagia, dental problems, itching, sneezing,  nasal congestion or discharge of excess mucus or purulent secretions, ear ache,   fever,  chills, sweats, unintended wt loss or wt gain, classically pleuritic or exertional cp,  orthopnea pnd or arm/hand swelling  or leg swelling, presyncope, palpitations, abdominal pain, anorexia, nausea, vomiting, diarrhea  or change in bowel habits or change in bladder habits, change in stools or change in urine, dysuria, hematuria,  rash, arthralgias, visual complaints, headache, numbness, weakness or ataxia or problems with walking or coordination,  change in mood or  memory.        Current Meds  Medication Sig  . albuterol (PROVENTIL HFA;VENTOLIN HFA) 108 (90 BASE) MCG/ACT inhaler Inhale 2 puffs into the lungs every 6 (six) hours as needed. Patient used this medication for wheezing and shortness of breath.  Marland Kitchen albuterol (PROVENTIL) (2.5 MG/3ML) 0.083% nebulizer solution Take 3 mLs (2.5 mg total) by nebulization every 6 (six) hours as needed for wheezing or shortness of breath.  . anastrozole (ARIMIDEX) 1 MG tablet Take 1 tablet by mouth daily.  . clomiPHENE (CLOMID) 50 MG tablet Take 50 mg by mouth daily.  Marland Kitchen levocetirizine (XYZAL) 5 MG tablet Take 1 tablet by mouth daily.  . [DISCONTINUED] Fluticasone-Salmeterol (ADVAIR) 250-50 MCG/DOSE AEPB Inhale 1 puff into the lungs 2 (two) times daily.  . [DISCONTINUED] lisinopril (PRINIVIL,ZESTRIL) 20 MG tablet Take 1 tablet (20 mg total) by mouth daily.  Review of Systems     Objective:   Physical Exam        Very pleasant amb bm nad  Wt Readings from Last 3 Encounters:  01/26/18 296 lb 6.4 oz (134.4 kg)  02/21/17 263 lb 11.2 oz (119.6 kg)  09/29/16 275 lb (124.7 kg)     Vital signs reviewed - Note on arrival 02 sats  97% on RA   HEENT: nl dentition,  and oropharynx. Nl external ear canals without cough reflex -  moderate bilateral non-specific turbinate edema     NECK :  without JVD/Nodes/TM/ nl carotid upstrokes bilaterally   LUNGS: no acc muscle use,  Nl contour chest which is clear to A and P bilaterally without  cough on insp or exp maneuvers   CV:  RRR  no s3 or murmur or increase in P2, and no edema   ABD:  soft and nontender with nl inspiratory excursion in the supine position. No bruits or organomegaly appreciated, bowel sounds nl  MS:  Nl gait/ ext warm without deformities, calf tenderness, cyanosis or clubbing No obvious joint restrictions   SKIN: warm and dry without lesions    NEURO:  alert, approp, nl sensorium with  no motor or cerebellar deficits apparent.           Assessment:

## 2018-01-26 NOTE — Assessment & Plan Note (Addendum)
FENO 01/26/2018  =   29  - 01/26/2018  After extensive coaching inhaler device  effectiveness =    90% try symbicort 80 2bid    DDX of  difficult airways management almost all start with A and  include Adherence, Ace Inhibitors, Acid Reflux, Active Sinus Disease, Alpha 1 Antitripsin deficiency, Anxiety masquerading as Airways dz,  ABPA,  Allergy(esp in young), Aspiration (esp in elderly), Adverse effects of meds,  Active smokers, A bunch of PE's (a small clot burden can't cause this syndrome unless there is already severe underlying pulm or vascular dz with poor reserve) plus two Bs  = Bronchiectasis and Beta blocker use..and one C= CHF  In this case Adherence is the biggest issue and starts with  inability to use HFA effectively and also  understand that SABA treats the symptoms but doesn't get to the underlying problem (inflammation).  I used  the analogy of putting steroid cream on a rash to help explain the meaning of topical therapy and the need to get the drug to the target tissue.   - see hfa teaching  ACEi adverse effects at the  top of the usual list of suspects and the only way to rule it out is a trial off > see a/p    ? Allergy > suggested by intermediate > do FENO   ? Active sinus dz  Sinus ct next ov if not better  ? CHF/ cardiac asthma > nothing to suggest this   Total time devoted to counseling  > 50 % of initial 60 min office visit:  review case with pt/ discussion of options/alternatives/ personally creating written customized instructions  in presence of pt  then going over those specific  Instructions directly with the pt including how to use all of the meds but in particular covering each new medication in detail and the difference between the maintenance= "automatic" meds and the prns using an action plan format for the latter (If this problem/symptom => do that organization reading Left to right).  Please see AVS from this visit for a full list of these instructions which I  personally wrote for this pt and  are unique to this visit.

## 2018-01-27 LAB — RESPIRATORY ALLERGY PROFILE REGION II ~~LOC~~
ALLERGEN, CEDAR TREE, T6: 0.51 kU/L — AB
ALLERGEN, COTTONWOOD, T14: 1.28 kU/L — AB
ALLERGEN, D PTERNOYSSINUS, D1: 0.85 kU/L — AB
ALLERGEN, OAK, T7: 6.67 kU/L — AB
ASPERGILLUS FUMIGATUS M3: 0.49 kU/L — AB
Allergen, A. alternata, m6: 1.86 kU/L — ABNORMAL HIGH
Allergen, Comm Silver Birch, t9: 6.41 kU/L — ABNORMAL HIGH
Allergen, P. notatum, m1: <0.1 kU/L
BERMUDA GRASS: 1.51 kU/L — AB
Box Elder IgE: 1.22 kU/L — ABNORMAL HIGH
CLADOSPORIUM HERBARUM (M2) IGE: 0.64 kU/L — ABNORMAL HIGH
CLASS: 0
CLASS: 0
CLASS: 1
CLASS: 2
CLASS: 2
CLASS: 2
CLASS: 2
CLASS: 2
CLASS: 2
COMMON RAGWEED (SHORT) (W1) IGE: 2.5 kU/L — ABNORMAL HIGH
Class: 0
Class: 0
Class: 0
Class: 0
Class: 1
Class: 1
Class: 2
Class: 2
Class: 2
Class: 2
Class: 2
Class: 2
Class: 2
Class: 3
Class: 3
D. FARINAE: 1.13 kU/L — AB
Elm IgE: 1.18 kU/L — ABNORMAL HIGH
IgE (Immunoglobulin E), Serum: 112 kU/L (ref <=114)
Johnson Grass: 0.9 kU/L — ABNORMAL HIGH
Pecan/Hickory Tree IgE: 1.22 kU/L — ABNORMAL HIGH
ROUGH PIGWEED IGE: 0.79 kU/L — AB
SHEEP SORREL IGE: 1.01 kU/L — AB
TIMOTHY GRASS: 1.9 kU/L — AB

## 2018-01-27 LAB — INTERPRETATION:

## 2018-01-28 ENCOUNTER — Telehealth: Payer: Self-pay | Admitting: Internal Medicine

## 2018-01-28 NOTE — Telephone Encounter (Addendum)
Charles Collier, Charles B, MD  Charles Collier, Charles Collier, CMA        Call patient : Studies are c/w mod severe allergies ragweed, trees, grass > no change rx for now Be sure patient has f/u ov so we can go over all the details of this study and get a plan together moving forward - ok to move up f/u if not feeling better and wants to be seen sooner    Pt is aware of above results and voiced his understanding.  Upcoming apt for 03/01/18 with PFT prior. Nothing further is needed.

## 2018-02-17 ENCOUNTER — Telehealth: Payer: Self-pay | Admitting: Internal Medicine

## 2018-02-17 NOTE — Telephone Encounter (Signed)
Attempted to contact pt. I did not receive an answer. There was no option for me to leave a message. Will try back.  

## 2018-02-18 NOTE — Telephone Encounter (Signed)
Called and spoke to pt. Informed him of the recs per MW. Pt verbalized understanding and states he will start back the Amlodipine. Pt states he is unsure of the strength but has an upcoming appt on 5/20 with MW. Pt aware to keep appt. Nothing further needed at this time.

## 2018-02-18 NOTE — Telephone Encounter (Signed)
Called and spoke to pt. Pt states he has been taking Micardis since prescribed on 01/26/2018, pt stopped taking Lisinopril and Amlodipine (this was not on med list nor historical meds). 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 Pt is requesting recs to lower pressure. Pt denies any increase or new s/s. Pt states the Symbicort has helped a lot since prescribed.   Dr. Sherene Sires please advise on BP med. Thanks.   LAST OV INSTRUCTIONS FROM 4.16.2019: Instructions   Stop lisinopril and start micardis 80 mg one daily in its place and your breathing should gradually normalize     Plan A = Automatic =  Symbicort 80 Take 2 puffs first thing in am and then another 2 puffs about 12 hours later.    Work on inhaler technique:  relax and gently blow all the way out then take a nice smooth deep breath back in, triggering the inhaler at same time you start breathing in.  Hold for up to 5 seconds if you can. Blow out thru nose. Rinse and gargle with water when done        Plan B = Backup Only use your albuterol as a rescue medication to be used if you can't catch your breath by resting or doing a relaxed purse lip breathing pattern.  - The less you use it, the better it will work when you need it. - Ok to use the inhaler up to 2 puffs  every 4 hours if you must but call for appointment if use goes up over your usual need - Don't leave home without it !!  (think of it like the spare tire for your car)    Plan C = Crisis - only use your albuterol nebulizer if you first try Plan B and it fails to help > ok to use the nebulizer up to every 4 hours but if start needing it regularly call for immediate appointment     Please remember to go to the lab department downstairs in the basement  for your tests - we will call you with the results when they are available.       Please schedule a follow up office visit in 4 weeks, sooner if needed with  pfts on return (hold all your inhalers that day if possible)

## 2018-02-18 NOTE — Telephone Encounter (Signed)
Continue micardis Add back the norvasc and should come right down

## 2018-03-01 ENCOUNTER — Ambulatory Visit: Payer: 59 | Admitting: Internal Medicine

## 2018-04-19 NOTE — Progress Notes (Deleted)
 @Patient  ID: Charles Collier, male    DOB: 01-21-1981, 37 y.o.   MRN: [id number]  No chief complaint on file.   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 Pulmonary Consult    Self referral for Asthma. He states he was dxed with Asthma age 89. He states that his breathing has been worse "for years" and also c/o occ chest tightness.  He uses his albuterol inhaler 3-4 x per day.  He uses his neb approx 2 x per wk.  He uses CPAP. He occ wakes up in the night and has to use his albuterol.     had not used saba  Prior to ov / cough is mucoid/ gen chest tightness even when not actively wheezing   No obvious day to day or daytime variability or assoc excess/ purulent sputum or mucus plugs or hemoptysis or cp  or overt sinus or hb symptoms. No unusual exposure hx or h/o childhood pna  or knowledge of premature birth.   Tests:  Imaging:   Cardiac:   Labs:   Micro:   Chart Review:       Allergies  Allergen Reactions  . Peanuts [Peanut Oil] Shortness Of Breath    Immunization History  Administered Date(s) Administered  . Influenza-Unspecified 08/16/2015, 09/02/2016  . Pneumococcal Polysaccharide-23 05/28/2016    Past Medical History:  Diagnosis Date  . Asthma   . Hypertension   . OSA on CPAP     Tobacco History: Social  History   Tobacco Use  Smoking Status Former Smoker  . Packs/day: 0.50  . Years: 16.00  . Pack years: 8.00  . Types: Cigarettes  . Last attempt to quit: 02/10/2017  . Years since quitting: 1.1  Smokeless Tobacco Never Used   Counseling given: Not Answered   Outpatient Encounter Medications as of 04/20/2018  Medication Sig  . albuterol (PROVENTIL HFA;VENTOLIN HFA) 108 (90 BASE) MCG/ACT inhaler Inhale 2 puffs into the lungs every 6 (six) hours as needed. Patient used this medication for wheezing and shortness of breath.  Marland Kitchen. albuterol (PROVENTIL) (2.5 MG/3ML) 0.083% nebulizer solution Take 3 mLs (2.5 mg total) by nebulization every 6 (six) hours as needed for wheezing or shortness of breath.  Marland Kitchen. amLODipine (NORVASC) 5 MG tablet Take 5 mg by mouth daily.  Marland Kitchen. anastrozole (ARIMIDEX) 1 MG tablet Take 1 tablet by mouth daily.  . budesonide-formoterol (SYMBICORT) 80-4.5 MCG/ACT inhaler Take 2 puffs first thing in am and then another 2 puffs about 12 hours later.  . clomiPHENE (CLOMID) 50 MG tablet Take 50 mg by mouth daily.  Marland Kitchen. levocetirizine (XYZAL) 5 MG tablet Take 1 tablet by mouth daily.  Marland Kitchen. telmisartan (MICARDIS) 80 MG tablet Take 1 tablet (80 mg total) by mouth daily.   No facility-administered encounter  medications on file as of 04/20/2018.      Review of Systems  Review of Systems    Constitutional:   No  weight loss, night sweats,  fevers, chills, fatigue, or  lassitude HEENT:   No headaches,  Difficulty swallowing,  Tooth/dental problems, or  Sore throat, No sneezing, itching, ear ache, nasal congestion, post nasal drip  CV: No chest pain,  orthopnea, PND, swelling in lower extremities, anasarca, dizziness, palpitations, syncope  GI: No heartburn, indigestion, abdominal pain, nausea, vomiting, diarrhea, change in bowel habits, loss of appetite, bloody stools Resp: No shortness of breath with exertion or at rest.  No excess mucus, no productive cough,  No non-productive cough,  No  coughing up of blood.  No change in color of mucus.  No wheezing.  No chest wall deformity Skin: no rash, lesions, no skin changes. GU: no dysuria, change in color of urine, no urgency or frequency.  No flank pain, no hematuria  MS:  No joint pain or swelling.  No decreased range of motion.  No back pain. Psych:  No change in mood or affect. No depression or anxiety.  No memory loss.      Physical Exam  There were no vitals taken for this visit.  Wt Readings from Last 5 Encounters:  01/26/18 296 lb 6.4 oz (134.4 kg)  02/21/17 263 lb 11.2 oz (119.6 kg)  09/29/16 275 lb (124.7 kg)  06/12/16 265 lb (120.2 kg)  02/25/15 285 lb (129.3 kg)     Physical Exam    GEN: A/Ox3; pleasant , NAD, well nourished, appears stated age HEENT:  [place]/AT,  EACs-clear, TMs-wnl, NOSE-clear, THROAT-clear, no lesions, no postnasal drip or exudate noted.  NECK:  Supple w/ fair ROM; no JVD; normal carotid impulses w/o bruits; no thyromegaly or nodules palpated; no lymphadenopathy.   RESP  Clear  P & A; w/o, wheezes/ rales/ or rhonchi. no accessory muscle use, no dullness to percussion CARD:  RRR, no m/r/g, no peripheral edema, pulses intact: radial 2+ bilaterally, DP 2+ bilaterally, no cyanosis or clubbing. GI:   Soft & nt; nml bowel sounds; no organomegaly or masses detected.  Musco: Warm bilaterally, no deformities or joint swelling noted.  Neuro: alert, no focal deficits noted.   Skin: Warm, no lesions or rashes     Lab Results:  CBC    Component Value Date/Time   WBC 10.4 01/26/2018 1220   RBC 5.64 01/26/2018 1220   HGB 15.9 01/26/2018 1220   HCT 48.0 01/26/2018 1220   PLT 260.0 01/26/2018 1220   MCV 85.1 01/26/2018 1220   MCH 29.0 02/22/2017 0743   MCHC 33.2 01/26/2018 1220   RDW 15.2 01/26/2018 1220   LYMPHSABS 3.9 01/26/2018 1220   MONOABS 0.9 01/26/2018 1220   EOSABS 0.9 (H) 01/26/2018 1220   BASOSABS 0.1 01/26/2018 1220    BMET    Component Value Date/Time   NA 140 02/22/2017  0743   K 3.8 02/22/2017 0743   CL 105 02/22/2017 0743   CO2 26 02/22/2017 0743   GLUCOSE 98 02/22/2017 0743   BUN 15 02/22/2017 0743   CREATININE 1.37 (H) 02/22/2017 0743   CALCIUM 8.9 02/22/2017 0743   GFRNONAA >60 02/22/2017 0743   GFRAA >60 02/22/2017 0743    BNP No results found for: BNP  ProBNP No results found for: PROBNP  Imaging: No results found.   Assessment & Plan:   No problem-specific Assessment & Plan notes found for this encounter.  Coral Ceo, NP 04/19/2018

## 2018-04-20 ENCOUNTER — Ambulatory Visit (INDEPENDENT_AMBULATORY_CARE_PROVIDER_SITE_OTHER): Payer: Self-pay | Admitting: Pulmonary Disease

## 2018-04-20 ENCOUNTER — Ambulatory Visit: Payer: 59 | Admitting: Pulmonary Disease

## 2018-04-20 ENCOUNTER — Encounter: Payer: Self-pay | Admitting: Pulmonary Disease

## 2018-04-20 VITALS — BP 160/90 | HR 75 | Ht 70.0 in | Wt 309.0 lb

## 2018-04-20 DIAGNOSIS — I10 Essential (primary) hypertension: Secondary | ICD-10-CM

## 2018-04-20 DIAGNOSIS — K219 Gastro-esophageal reflux disease without esophagitis: Secondary | ICD-10-CM

## 2018-04-20 DIAGNOSIS — J4530 Mild persistent asthma, uncomplicated: Secondary | ICD-10-CM

## 2018-04-20 LAB — POCT EXHALED NITRIC OXIDE: FENO LEVEL (PPB): 33

## 2018-04-20 MED ORDER — BUDESONIDE-FORMOTEROL FUMARATE 160-4.5 MCG/ACT IN AERO: 2.0000 | INHALATION_SPRAY | Freq: Two times a day (BID) | RESPIRATORY_TRACT | 0 refills | Status: DC

## 2018-04-20 MED ORDER — MONTELUKAST SODIUM 10 MG PO TABS: 10.0000 mg | ORAL_TABLET | Freq: Every day | ORAL | 11 refills | Status: DC

## 2018-04-20 MED ORDER — OMEPRAZOLE 20 MG PO CPDR: 20.0000 mg | DELAYED_RELEASE_CAPSULE | Freq: Every day | ORAL | 4 refills | Status: DC

## 2018-04-20 MED ORDER — TELMISARTAN 40 MG PO TABS: ORAL_TABLET | ORAL | 2 refills | Status: DC

## 2018-04-20 MED ORDER — AMLODIPINE BESYLATE 10 MG PO TABS: 10.0000 mg | ORAL_TABLET | Freq: Every day | ORAL | 2 refills | Status: DC

## 2018-04-20 NOTE — Assessment & Plan Note (Addendum)
Reordered micardis the patient could resume use Increase amlodipine to 10 mg daily Continue to monitor blood pressure daily Continue to work on diet and exercise Continue CPAP therapy Referral to primary care

## 2018-04-20 NOTE — Progress Notes (Signed)
Discussed results with patient in office.  Nothing further is needed at this time.  Airik Goodlin FNP  

## 2018-04-20 NOTE — Progress Notes (Signed)
_0  ID: Charles Collier, male    DOB: Feb 20, 1981, 37 y.o.   MRN: [id number]  Chief Complaint  Patient presents with  . Follow-up    States his chest is constantly tight despite using symbicort daily. worse when stationary and at night.     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  Followed for seasonal asthma.  Patient also has obstructive sleep apnea managed on CPAP by Dr. Maxwell Caul and Sadie Haber.   Recent [facility] Encounters:   01/26/2018 1st  Pulmonary office visit/ [facility]   Chief Complaint  Patient presents with  . Pulmonary Consult    Self referral for Asthma. He states he was dxed with Asthma age 81. He states that his breathing has been worse "for years" and also c/o occ chest tightness.  He uses his albuterol inhaler 3-4 x per day.  He uses his neb approx 2 x per wk.  He uses CPAP. He occ wakes up in the night and has to use his albuterol.     had not used saba  Prior to ov / cough is mucoid/ gen chest tightness even when not actively wheezing   No obvious day to day or daytime variability or assoc excess/ purulent sputum or mucus plugs or hemoptysis or cp  or overt sinus or hb symptoms. No unusual exposure hx or h/o childhood pna  or knowledge of premature birth. Plan: Symbicort 80, PFT, follow-up in 4 weeks, Feno 26, stop ace start micardis      04/20/18 Acute Pleasant 37 year old patient seen in office today.  Patient with continued shortness of breath as well as occasional chest pain with exertion.  Unfortunately patient is now off of myocarditis and is hypertensive.  Patient reports there is been an issue with his pharmacy and they no longer have the 80 mg tablets and need a change in his prescription to 40 mg tablets.  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  Patient's primary care doctor is leaving he needs to be reestablished with a  different primary care.  Patient also knows that he needs to complete his pulmonary function test as he canceled that as well as his 4-week follow-up with Dr. Melvyn Novas.  Patient is reporting having to use albuterol inhaler more about 3-4 times a week.  Feels that Symbicort initially did help but has been having more exacerbations recently.  Patient reporting that this also has to do with his allergies.  Patient reports he has been using his CPAP currently managed by [facility] Dr. Maxwell Caul.   Allergies  Allergen Reactions  . Peanuts [Peanut Oil] Shortness Of Breath    Immunization History  Administered Date(s) Administered  . Influenza-Unspecified 08/16/2015, 09/02/2016  . Pneumococcal Polysaccharide-23 05/28/2016    Past Medical History:  Diagnosis Date  . Asthma   . Hypertension   . OSA on CPAP     Tobacco History: Social History   Tobacco Use  Smoking Status Former Smoker  . Packs/day: 0.50  . Years: 16.00  . Pack years: 8.00  . Types: Cigarettes  . Last attempt to quit: 02/10/2017  . Years since quitting: 1.1  Smokeless Tobacco Never Used   Counseling given: Yes Continue not smoking  Outpatient Encounter Medications as of 04/20/2018  Medication Sig  . albuterol (PROVENTIL HFA;VENTOLIN HFA) 108 (90 BASE) MCG/ACT inhaler Inhale 2 puffs into the lungs every 6 (six) hours  as needed. Patient used this medication for wheezing and shortness of breath.  Marland Kitchen albuterol (PROVENTIL) (2.5 MG/3ML) 0.083% nebulizer solution Take 3 mLs (2.5 mg total) by nebulization every 6 (six) hours as needed for wheezing or shortness of breath.  Marland Kitchen amLODipine (NORVASC) 10 MG tablet Take 10 mg by mouth daily.  Marland Kitchen anastrozole (ARIMIDEX) 1 MG tablet Take 1 tablet by mouth daily.  . budesonide-formoterol (SYMBICORT) 80-4.5 MCG/ACT inhaler Take 2 puffs first thing in am and then another 2 puffs about 12 hours later.  . clomiPHENE (CLOMID) 50 MG tablet Take 50 mg by mouth daily.  Marland Kitchen levocetirizine (XYZAL) 5 MG  tablet Take 1 tablet by mouth daily.  Marland Kitchen telmisartan (MICARDIS) 80 MG tablet Take 1 tablet (80 mg total) by mouth daily.  . [DISCONTINUED] amLODipine (NORVASC) 5 MG tablet Take 5 mg by mouth daily.  Marland Kitchen amLODipine (NORVASC) 10 MG tablet Take 1 tablet (10 mg total) by mouth daily.  . montelukast (SINGULAIR) 10 MG tablet Take 1 tablet (10 mg total) by mouth at bedtime.  Marland Kitchen omeprazole (PRILOSEC) 20 MG capsule Take 1 capsule (20 mg total) by mouth daily.  Marland Kitchen telmisartan (MICARDIS) 40 MG tablet Take two tablets (4m) by mouth daily   No facility-administered encounter medications on file as of 04/20/2018.      Review of Systems  Review of Systems  Constitutional: Positive for fatigue. Negative for diaphoresis and fever.  HENT: Negative for congestion, postnasal drip, sinus pressure, sinus pain, sneezing and sore throat.   Respiratory: Positive for chest tightness and shortness of breath. Negative for cough and wheezing.   Cardiovascular: Negative for chest pain and palpitations.  Gastrointestinal: Negative for abdominal pain, blood in stool, constipation and diarrhea.       Indigestion / heartburn   Genitourinary: Negative for hematuria.  Musculoskeletal: Negative for back pain.  Skin: Negative for color change.  Allergic/Immunologic: Positive for environmental allergies.  Neurological: Negative for dizziness, light-headedness and headaches.  Psychiatric/Behavioral: Negative for confusion. The patient is not nervous/anxious.   All other systems reviewed and are negative.    Physical Exam  BP (!) 160/90   Pulse 75   Ht 5' 10" (1.778 m)   Wt (!) 309 lb (140.2 kg)   SpO2 97%   BMI 44.34 kg/m   Wt Readings from Last 5 Encounters:  04/20/18 (!) 309 lb (140.2 kg)  01/26/18 296 lb 6.4 oz (134.4 kg)  02/21/17 263 lb 11.2 oz (119.6 kg)  09/29/16 275 lb (124.7 kg)  06/12/16 265 lb (120.2 kg)     Physical Exam  Constitutional: He is oriented to person, place, and time and well-developed,  well-nourished, and in no distress. No distress.  HENT:  Head: Normocephalic and atraumatic.  Right Ear: External ear normal.  Left Ear: External ear normal.  Nose: Nose normal.  Mouth/Throat: Oropharynx is clear and moist. No oropharyngeal exudate.  Eyes: Pupils are equal, round, and reactive to light.  Neck: Normal range of motion. Neck supple.  Cardiovascular: Normal rate, regular rhythm and normal heart sounds.  Pulmonary/Chest: Effort normal and breath sounds normal. No respiratory distress. He has no decreased breath sounds. He has no wheezes.  Abdominal: Soft. Bowel sounds are normal. He exhibits no distension. There is no tenderness.  Musculoskeletal: Normal range of motion.  Lymphadenopathy:    He has no cervical adenopathy.  Neurological: He is alert and oriented to person, place, and time. Gait normal.  Skin: Skin is warm and dry. He is not diaphoretic. No  erythema.  Psychiatric: Mood, memory, affect and judgment normal.  Nursing note and vitals reviewed.    Lab Results:  CBC    Component Value Date/Time   WBC 10.4 01/26/2018 1220   RBC 5.64 01/26/2018 1220   HGB 15.9 01/26/2018 1220   HCT 48.0 01/26/2018 1220   PLT 260.0 01/26/2018 1220   MCV 85.1 01/26/2018 1220   MCH 29.0 02/22/2017 0743   MCHC 33.2 01/26/2018 1220   RDW 15.2 01/26/2018 1220   LYMPHSABS 3.9 01/26/2018 1220   MONOABS 0.9 01/26/2018 1220   EOSABS 0.9 (H) 01/26/2018 1220   BASOSABS 0.1 01/26/2018 1220    BMET    Component Value Date/Time   NA 140 02/22/2017 0743   K 3.8 02/22/2017 0743   CL 105 02/22/2017 0743   CO2 26 02/22/2017 0743   GLUCOSE 98 02/22/2017 0743   BUN 15 02/22/2017 0743   CREATININE 1.37 (H) 02/22/2017 0743   CALCIUM 8.9 02/22/2017 0743   GFRNONAA >60 02/22/2017 0743   GFRAA >60 02/22/2017 0743    BNP No results found for: BNP  ProBNP No results found for: PROBNP  Imaging: No results found.   Assessment & Plan:   Pleasant 37 year old patient seen in  office today.  Patient with a myriad of chronic issues.  Feno today is still elevated (33).  Will trial patient on 160 Symbicort.  Emphasized the importance of adhering to correct dose.  Will also restart Singulair.  Referral to primary care for patient to be established after August/10/2017 when patient's insurance coverage restarts.  Fix prescription for Micardis the patient can resume use, also increased amlodipine to 10 mg daily.  We will have patient continue to check blood pressure at home daily, patient to call if blood pressure is still not controlled, or if he starts having symptoms of dizziness lightheadedness or lower blood pressures with these regimen changes.  Discussed with patient extensively that he needs to complete pulmonary function testing.  Patient will get this rescheduled.  Probable GERD.  Will start omeprazole 20 mg.  Patient currently without insurance.  Patient reports he will have insurance on 05/13/2018.  Provided multiple good Rx coupons for management of medications.  Essential hypertension Reordered micardis the patient could resume use Increase amlodipine to 10 mg daily Continue to monitor blood pressure daily Continue to work on diet and exercise Continue CPAP therapy Referral to primary care    GERD (gastroesophageal reflux disease) Probable GERD  Therapeutic trial of omeprazole 20 mg daily Information provided regarding GERD as well as GERD diet to patient  Mild persistent asthma Increase Symbicort to 160 from 80 >>> 2 puffs in the morning right when you wake up, rinse out your mouth after use, 12 hours later 2 puffs, rinse after use >>> Take this daily, no matter what >>> This is not a rescue inhaler   Restart Singulair 73m  >>> Take at night  Start omeprazole 20 mg daily for probable GERD >>> Take in the morning on empty stomach, 1 hour before the rest of your medications or eating  Continue CPAP therapy and follow-up with [facility]  Reschedule  pulmonary function test to be completed within the next 4-6 weeks  Referral to primary care      BLauraine Rinne NP 04/20/2018

## 2018-04-20 NOTE — Addendum Note (Signed)
Addended by: Kerin RansomBLACKWELL, Raeshaun Simson on: 04/20/2018 01:52 PM   Modules accepted: Orders

## 2018-04-20 NOTE — Assessment & Plan Note (Signed)
Probable GERD  Therapeutic trial of omeprazole 20 mg daily Information provided regarding GERD as well as GERD diet to patient

## 2018-04-20 NOTE — Assessment & Plan Note (Signed)
Increase Symbicort to 160 from 80 >>> 2 puffs in the morning right when you wake up, rinse out your mouth after use, 12 hours later 2 puffs, rinse after use >>> Take this daily, no matter what >>> This is not a rescue inhaler   Restart Singulair 10mg   >>> Take at night  Start omeprazole 20 mg daily for probable GERD >>> Take in the morning on empty stomach, 1 hour before the rest of your medications or eating  Continue CPAP therapy and follow-up with [facility]  Reschedule pulmonary function test to be completed within the next 4-6 weeks  Referral to primary care

## 2018-04-20 NOTE — Progress Notes (Signed)
Chart and office note reviewed in detail  > agree with a/p as outlined    

## 2018-04-20 NOTE — Patient Instructions (Addendum)
Change prescription to Micardis to two 40 mg tablets daily, due to pharmacy limitations with medication Increase amlodipine to 10 mg daily  Increase Symbicort to 160 from 80 >>> 2 puffs in the morning right when you wake up, rinse out your mouth after use, 12 hours later 2 puffs, rinse after use >>> Take this daily, no matter what >>> This is not a rescue inhaler   Restart Singulair 10mg   >>> Take at night  Start omeprazole 20 mg daily for probable GERD >>> Take in the morning on empty stomach, 1 hour before the rest of your medications or eating  Continue CPAP therapy and follow-up with [facility]  Reschedule pulmonary function test to be completed within the next 4-6 weeks  Referral to primary care   Contact our office with any changes in your health or concerns.    Please contact the office if your symptoms worsen or you have concerns that you are not improving.   Thank you for choosing [facility] for your healthcare, and for allowing [place] to partner with you on your healthcare journey. I am thankful to be able to provide care to you today.   Elisha Headland FNP-C      Gastroesophageal Reflux Disease, Adult Normally, food travels down the esophagus and stays in the stomach to be digested. If a person has gastroesophageal reflux disease (GERD), food and stomach acid move back up into the esophagus. When this happens, the esophagus becomes sore and swollen (inflamed). Over time, GERD can make small holes (ulcers) in the lining of the esophagus. Follow these instructions at home: Diet  Follow a diet as told by your doctor. You may need to avoid foods and drinks such as: ? Coffee and tea (with or without caffeine). ? Drinks that contain alcohol. ? Energy drinks and sports drinks. ? Carbonated drinks or sodas. ? Chocolate and cocoa. ? Peppermint and mint flavorings. ? Garlic and onions. ? Horseradish. ? Spicy and acidic foods, such as peppers, chili powder, curry  powder, vinegar, hot sauces, and BBQ sauce. ? Citrus fruit juices and citrus fruits, such as oranges, lemons, and limes. ? Tomato-based foods, such as red sauce, chili, salsa, and pizza with red sauce. ? Fried and fatty foods, such as donuts, french fries, potato chips, and high-fat dressings. ? High-fat meats, such as hot dogs, rib eye steak, sausage, ham, and bacon. ? High-fat dairy items, such as whole milk, butter, and cream cheese.  Eat small meals often. Avoid eating large meals.  Avoid drinking large amounts of liquid with your meals.  Avoid eating meals during the 2-3 hours before bedtime.  Avoid lying down right after you eat.  Do not exercise right after you eat. General instructions  Pay attention to any changes in your symptoms.  Take over-the-counter and prescription medicines only as told by your doctor. Do not take aspirin, ibuprofen, or other NSAIDs unless your doctor says it is okay.  Do not use any tobacco products, including cigarettes, chewing tobacco, and e-cigarettes. If you need help quitting, ask your doctor.  Wear loose clothes. Do not wear anything tight around your waist.  Raise (elevate) the head of your bed about 6 inches (15 cm).  Try to lower your stress. If you need help doing this, ask your doctor.  If you are overweight, lose an amount of weight that is healthy for you. Ask your doctor about a safe weight loss goal.  Keep all follow-up visits as told by your doctor. This  is important. Contact a doctor if:  You have new symptoms.  You lose weight and you do not know why it is happening.  You have trouble swallowing, or it hurts to swallow.  You have wheezing or a cough that keeps happening.  Your symptoms do not get better with treatment.  You have a hoarse voice. Get help right away if:  You have pain in your arms, neck, jaw, teeth, or back.  You feel sweaty, dizzy, or light-headed.  You have chest pain or shortness of  breath.  You throw up (vomit) and your throw up looks like blood or coffee grounds.  You pass out (faint).  Your poop (stool) is bloody or black.  You cannot swallow, drink, or eat. This information is not intended to replace advice given to you by your health care provider. Make sure you discuss any questions you have with your health care provider. Document Released: 03/17/2008 Document Revised: 03/06/2016 Document Reviewed: 01/24/2015 Elsevier Interactive Patient Education  2018 [employer]    Food Choices for Gastroesophageal Reflux Disease, Adult When you have gastroesophageal reflux disease (GERD), the foods you eat and your eating habits are very important. Choosing the right foods can help ease your discomfort. What guidelines do I need to follow? Choose fruits, vegetables, whole grains, and low-fat dairy products. Choose low-fat meat, fish, and poultry. Limit fats such as oils, salad dressings, butter, nuts, and avocado. Keep a food diary. This helps you identify foods that cause symptoms. Avoid foods that cause symptoms. These may be different for everyone. Eat small meals often instead of 3 large meals a day. Eat your meals slowly, in a place where you are relaxed. Limit fried foods. Cook foods using methods other than frying. Avoid drinking alcohol. Avoid drinking large amounts of liquids with your meals. Avoid bending over or lying down until 2-3 hours after eating. What foods are not recommended? These are some foods and drinks that may make your symptoms worse: Vegetables Tomatoes. Tomato juice. Tomato and spaghetti sauce. Chili peppers. Onion and garlic. Horseradish. Fruits Oranges, grapefruit, and lemon (fruit and juice). Meats High-fat meats, fish, and poultry. This includes hot dogs, ribs, ham, sausage, salami, and bacon. Dairy Whole milk and chocolate milk. Sour cream. Cream. Butter. Ice cream. Cream cheese. Drinks Coffee and tea. Bubbly (carbonated)  drinks or energy drinks. Condiments Hot sauce. Barbecue sauce. Sweets/Desserts Chocolate and cocoa. Donuts. Peppermint and spearmint. Fats and Oils High-fat foods. This includes [place] fries and potato chips. Other Vinegar. Strong spices. This includes black pepper, white pepper, red pepper, cayenne, curry powder, cloves, ginger, and chili powder. The items listed above may not be a complete list of foods and drinks to avoid. Contact your dietitian for more information. This information is not intended to replace advice given to you by your health care provider. Make sure you discuss any questions you have with your health care provider. Document Released: 03/30/2012 Document Revised: 03/06/2016 Document Reviewed: 08/03/2013 Elsevier Interactive Patient Education  2017 [employer]

## 2018-05-10 ENCOUNTER — Ambulatory Visit: Payer: 59 | Admitting: Internal Medicine

## 2018-05-17 ENCOUNTER — Telehealth: Payer: Self-pay | Admitting: Pulmonary Disease

## 2018-05-17 NOTE — Telephone Encounter (Signed)
Ov with all meds in hand - no way to evaluate this over the phone - see me or NP - ok to overbook me at end of the day

## 2018-05-17 NOTE — Telephone Encounter (Signed)
Called and spoke with pt regarding symbicort not working well for pt Advised pt to make appt as he is having SOB and chest tightness. Scheduled appt with BW 05/19/18 at 9am Nothing further needed

## 2018-05-17 NOTE — Telephone Encounter (Signed)
Spoke with pt. States that Symbicort is not working well for him. Still having issues with shortness of breath and chest tightness. He would like to have something else prescribed.  MW - please advise. Thanks.

## 2018-05-19 ENCOUNTER — Encounter: Payer: Self-pay | Admitting: Primary Care

## 2018-05-19 ENCOUNTER — Other Ambulatory Visit: Payer: No Typology Code available for payment source

## 2018-05-19 ENCOUNTER — Ambulatory Visit (INDEPENDENT_AMBULATORY_CARE_PROVIDER_SITE_OTHER): Payer: No Typology Code available for payment source | Admitting: Family Medicine

## 2018-05-19 ENCOUNTER — Encounter: Payer: Self-pay | Admitting: Family Medicine

## 2018-05-19 ENCOUNTER — Telehealth: Payer: Self-pay | Admitting: Family Medicine

## 2018-05-19 ENCOUNTER — Ambulatory Visit (INDEPENDENT_AMBULATORY_CARE_PROVIDER_SITE_OTHER): Payer: No Typology Code available for payment source | Admitting: Primary Care

## 2018-05-19 VITALS — BP 128/78 | HR 59 | Temp 98.4°F | Ht 70.0 in | Wt 298.2 lb

## 2018-05-19 VITALS — BP 120/78 | HR 85 | Ht 70.0 in | Wt 299.2 lb

## 2018-05-19 DIAGNOSIS — F4010 Social phobia, unspecified: Secondary | ICD-10-CM

## 2018-05-19 DIAGNOSIS — K219 Gastro-esophageal reflux disease without esophagitis: Secondary | ICD-10-CM

## 2018-05-19 DIAGNOSIS — R0781 Pleurodynia: Secondary | ICD-10-CM

## 2018-05-19 DIAGNOSIS — I10 Essential (primary) hypertension: Secondary | ICD-10-CM

## 2018-05-19 DIAGNOSIS — R7989 Other specified abnormal findings of blood chemistry: Secondary | ICD-10-CM | POA: Insufficient documentation

## 2018-05-19 DIAGNOSIS — J4540 Moderate persistent asthma, uncomplicated: Secondary | ICD-10-CM

## 2018-05-19 DIAGNOSIS — J4530 Mild persistent asthma, uncomplicated: Secondary | ICD-10-CM

## 2018-05-19 DIAGNOSIS — R0601 Orthopnea: Secondary | ICD-10-CM | POA: Insufficient documentation

## 2018-05-19 DIAGNOSIS — J45901 Unspecified asthma with (acute) exacerbation: Secondary | ICD-10-CM | POA: Insufficient documentation

## 2018-05-19 DIAGNOSIS — J4521 Mild intermittent asthma with (acute) exacerbation: Secondary | ICD-10-CM | POA: Diagnosis not present

## 2018-05-19 LAB — POCT EXHALED NITRIC OXIDE: FeNO level (ppb): 26

## 2018-05-19 MED ORDER — SERTRALINE HCL 50 MG PO TABS: 50.0000 mg | ORAL_TABLET | Freq: Every day | ORAL | 3 refills | Status: DC

## 2018-05-19 MED ORDER — BUDESONIDE-FORMOTEROL FUMARATE 160-4.5 MCG/ACT IN AERO: 2.0000 | INHALATION_SPRAY | Freq: Two times a day (BID) | RESPIRATORY_TRACT | 0 refills | Status: DC

## 2018-05-19 MED ORDER — ALBUTEROL SULFATE HFA 108 (90 BASE) MCG/ACT IN AERS: 2.0000 | INHALATION_SPRAY | Freq: Four times a day (QID) | RESPIRATORY_TRACT | 3 refills | Status: DC | PRN

## 2018-05-19 MED ORDER — METOPROLOL SUCCINATE ER 50 MG PO TB24: 50.0000 mg | ORAL_TABLET | Freq: Every day | ORAL | 5 refills | Status: DC

## 2018-05-19 MED ORDER — PREDNISONE 10 MG PO TABS: ORAL_TABLET | ORAL | 0 refills | Status: DC

## 2018-05-19 MED ORDER — PANTOPRAZOLE SODIUM 40 MG PO TBEC: 40.0000 mg | DELAYED_RELEASE_TABLET | Freq: Every day | ORAL | 1 refills | Status: DC

## 2018-05-19 NOTE — Telephone Encounter (Signed)
Copied from CRM [phone]. Topic: General - Other >> May 17, 2018  2:32 PM Gerrianne ScalePayne, Angela L wrote: Reason for CRM: pt calling stating that he has an appt at the pulmonologist [address]  Dr Shona Simpsonwertz the same day [facility] and wanted [facility] to know that he need a referral to go there his appt is at 9 that morning because of asthma  please call pt at  [phone] if any questions he also states that [facility] a [facility] [address]  his appt is the same day also at 11 pt has sleep apnea he has the [employer] insurance  Will discuss at appt today 8.7.2019

## 2018-05-19 NOTE — Assessment & Plan Note (Addendum)
-   Needs to return for full PFTs, scheduled for September

## 2018-05-19 NOTE — Progress Notes (Signed)
Pre visit review using our clinic review tool, if applicable. No additional management support is needed unless otherwise documented below in the visit note. 

## 2018-05-19 NOTE — Progress Notes (Signed)
Chief Complaint  Patient presents with  . New Patient (Initial Visit)       New Patient Visit SUBJECTIVE: HPI: Charles Collier is an 37 y.o.male who is being seen for establishing care.  Hx of GERD- currently on Prilosec 20 mg, not helpful. Trying to lose weight. Unsure what his triggers are regarding foods.  HTN- doing well with Micardis 80 mg and Norvasc 10 mg/d. Compliant. 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   Social anxiety- This has been on going since early adolescence. He will get very nervous and anxious meeting new people. He denies any personal or family history of mental health disorders otherwise.  He is not following with a counselor/psychologist.  Allergies  Allergen Reactions  . Peanuts [Peanut Oil] Shortness Of Breath    Past Medical History:  Diagnosis Date  . Arthritis   . Asthma   . GERD (gastroesophageal reflux disease)   . Heart murmur   . Hyperlipidemia   . Hypertension   . OSA on CPAP    Past Surgical History:  Procedure Laterality Date  . ANTERIOR CRUCIATE LIGAMENT REPAIR     Family History  Problem Relation Age of Onset  . Hypertension Mother   . Hypertension Sister   . Hypertension Maternal Grandmother   . Hyperlipidemia Maternal Grandmother   . Cancer Maternal Grandfather        prostate  . Hearing loss Maternal Grandfather   . Hypertension Maternal Grandfather   . Hypertension Paternal Grandmother   . Hypertension Paternal Grandfather    Allergies  Allergen Reactions  . Peanuts [Peanut Oil] Shortness Of Breath    Current Outpatient Medications:  .  albuterol (PROVENTIL HFA;VENTOLIN HFA) 108 (90 Base) MCG/ACT inhaler, Inhale 2 puffs into the lungs every 6 (six) hours as needed. Patient used this medication for wheezing and shortness of breath., Disp: 1 Inhaler, Rfl: 3 .  albuterol (PROVENTIL) (2.5 MG/3ML) 0.083% nebulizer solution, Take 3 mLs (2.5 mg total) by nebulization every 6 (six) hours as needed for  wheezing or shortness of breath., Disp: 75 mL, Rfl: 2 .  amLODipine (NORVASC) 10 MG tablet, Take 1 tablet (10 mg total) by mouth daily., Disp: 30 tablet, Rfl: 2 .  anastrozole (ARIMIDEX) 1 MG tablet, Take 1 tablet by mouth daily., Disp: , Rfl: 10 .  budesonide-formoterol (SYMBICORT) 160-4.5 MCG/ACT inhaler, Inhale 2 puffs into the lungs every 12 (twelve) hours., Disp: 1 Inhaler, Rfl: 0 .  clomiPHENE (CLOMID) 50 MG tablet, Take 50 mg by mouth daily., Disp: , Rfl:  .  levocetirizine (XYZAL) 5 MG tablet, Take 1 tablet by mouth daily., Disp: , Rfl:  .  montelukast (SINGULAIR) 10 MG tablet, Take 1 tablet (10 mg total) by mouth at bedtime., Disp: 30 tablet, Rfl: 11 .  predniSONE (DELTASONE) 10 MG tablet, Take 4 tabs x 2 days; then 2 tabs x2 days; then 1 tab x2 days; then stop, Disp: 14 tablet, Rfl: 0 .  telmisartan (MICARDIS) 40 MG tablet, Take two tablets (80mg ) by mouth daily, Disp: 60 tablet, Rfl: 2 .  metoprolol succinate (TOPROL-XL) 50 MG 24 hr tablet, Take 1 tablet (50 mg total) by mouth daily. Take with or immediately following a meal., Disp: 30 tablet, Rfl: 5 .  pantoprazole (PROTONIX) 40 MG tablet, Take 1 tablet (40 mg total) by mouth daily., Disp: 30 tablet, Rfl: 1 .  sertraline (ZOLOFT) 50 MG tablet, Take 1 tablet (50 mg total) by mouth daily. Take 1/2 tab daily for first  2 weeks., Disp: 30 tablet, Rfl: 3  ROS Psych:  As noted in HPI  GI: + Reflux symptoms   OBJECTIVE: BP 128/78 (BP Location: Left Arm, Patient Position: Sitting, Cuff Size: Large)   Pulse (!) 59   Temp 98.4 F (36.9 C) (Oral)   Ht 5\' 10"  (1.778 m)   Wt 298 lb 4 oz (135.3 kg)   SpO2 96%   BMI 42.79 kg/m   Constitutional: -  VS reviewed -  Well developed, well nourished, appears stated age -  No apparent distress  Psychiatric: -  Oriented to person, place, and time -  Memory intact -  Affect and mood normal -  Fluent conversation, good eye contact -  Judgment and insight age appropriate  Eye: -  Conjunctivae  clear, no discharge -  Pupils symmetric, round, reactive to light  ENMT: -  MMM    Pharynx moist, no exudate, no erythema  Neck: -  No gross swelling, no palpable masses -  Thyroid midline, not enlarged, mobile, no palpable masses  Cardiovascular: -  RRR -  No LE edema  Respiratory: -  Normal respiratory effort, no accessory muscle use, no retraction -  Breath sounds equal, no wheezes, no ronchi, no crackles  Gastrointestinal: -  Bowel sounds normal -  No tenderness, no distention, no guarding, no masses  Skin: -  No significant lesion on inspection -  Warm and dry to palpation   ASSESSMENT/PLAN: Gastroesophageal reflux disease, esophagitis presence not specified - Plan: pantoprazole (PROTONIX) 40 MG tablet  Moderate persistent asthma without complication - Plan: Ambulatory referral to Pulmonology, budesonide-formoterol (SYMBICORT) 160-4.5 MCG/ACT inhaler  Low testosterone level in male - Plan: Ambulatory referral to Urology  Social anxiety disorder - Plan: sertraline (ZOLOFT) 50 MG tablet  Essential hypertension - Plan: metoprolol succinate (TOPROL-XL) 50 MG 24 hr tablet  Patient instructed to sign release of records form from his previous PCP. Change PPI's.  Reorder above. Refer for insurance purposes. Start Zoloft. [facility]. Add BB, I think he is close. Go back to 10 mg/d Norvasc instead of 20 mg/d. Patient should return 6 weeks. The patient voiced understanding and agreement to the plan.   Jilda Rocheicholas Paul MekoryukWendling, DO 05/19/18  4:36 PM

## 2018-05-19 NOTE — Assessment & Plan Note (Addendum)
-   Experiencing episodes of orthopnea when lying flat and at night  - Check Echocardiogram

## 2018-05-19 NOTE — Patient Instructions (Addendum)
Please consider counseling. Contact [phone] to schedule an appointment or inquire about cost/insurance coverage.  Try a spoonful of pickle juice before bed to help with cramping. Drink lots of water.  Coping skills Choose 5 that work for you:  Take a deep breath  Count to 20  Read a book  Do a puzzle  Meditate  Bake  Sing  Knit  Garden  Pray  Go outside  Call a friend  Listen to music  Take a walk  Color  Send a note  Take a bath  Watch a movie  Be alone in a quiet place  Pet an animal  Visit a friend  Journal  Exercise  Stretch   The only lifestyle changes that have data behind them are weight loss for the overweight/obese and elevating the head of the bed. Finding out which foods/positions are triggers is important.  Stay active and keep the diet clean.   Let us know if you need anything.

## 2018-05-19 NOTE — Patient Instructions (Addendum)
Echocardiogram re: orthopnea   Prednisone today for mild flare of asthma symptoms- FENO 26   Recommend checking D-dimer with labs today (re: pain with inspiration)  Follow through with Pulmonary function tests as scheduled in September

## 2018-05-19 NOTE — Progress Notes (Signed)
 @Patient  ID: Charles Collier, male    DOB: 1981-05-04, 37 y.o.   MRN: [id number]  Chief Complaint  Patient presents with  . Follow-up    chest tightness, pain in ribs while lying down or sitting, ok at gym and on exertion    Referring provider: No ref. provider found  HPI: 37 year old male, former smoker. Patient is a Naval architecttruck driver. Hx HTN, mild persistent asthma, GERD. Patient of Dr. Sherene SiresWert followed for asthma. Last seen on 01/26/18 and 04/20/18.   Seasonal asthma as child requiring as needed inhalers but only late summer / early fall and not necessarily before ex and able to do sports football runny back/ [place] and started smoking p high school until 02/2017 after bad asthma attack > admit [facility] but even p quit smoking on advair still having chest tightness daily and having to use more and more ventolin to up to 4 x daily and noct saba as well since mid march 2019 and even using nebulizer up to once day while maintain on advair and ACEi  FENO 01/26/2018  =   29  - 01/26/2018  After extensive coaching inhaler device  effectiveness =    90% try symbicort 80 2bid  - Allergy profile 01/26/18  >  Eos 0.9/  IgE  112  Ragweed, trees, grass   Feno 04/20/18 - 33 Trial of Symbicort 160 started on 04/20/18 Trial of singulair 04/20/18  04/20/18 Pleasant 37 year old patient seen in office today.  Patient with continued shortness of breath as well as occasional chest pain with exertion.  Unfortunately patient is now off of myocarditis and is hypertensive.  Patient reports there is been an issue with his pharmacy and they no longer have the 80 mg tablets and need a change in his prescription to 40 mg tablets.  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  Patient's primary care doctor is leaving he needs to be reestablished with a different primary care.  Patient also knows that he needs to complete his pulmonary function test as he canceled that as well as his  4-week follow-up with Dr. Sherene SiresWert. Patient is reporting having to use albuterol inhaler more about 3-4 times a week.  Feels that Symbicort initially did help but has been having more exacerbations recently. Patient reporting that this also has to do with his allergies. Patient reports he has been using his CPAP currently managed by equals Dr. Earl Galasborne.  05/19/2018 Complains of SOB when lying flat or sitting. Associated chest tightness and pain in side/back with deep breath. Denies cough or mucus production. Reports no breathing problems with exercise, uses elliptical and lifts weights. He originally saw improvement with Symbicort 80 but lately he feels it has not been working. Symbicort was recently increased to 160 at last appointment. Shortness of breath wakes him up at night occasionally and requires rescue inhaler. Reports that his rescue inhaler helps sometimes but not always. Patient has sleep apnea and wears CPAP everynight. Follows with Johnette AbrahamJen Willard for OSA, has an apt today.   BP has been elevated, started on Norvasc 10mg  and continues Micardis. Patient has been doubling up on Norvasc for the past 3 days because he noticed his sbp was elevated in 150s. BP 120/78 on recheck today. Patient also has an apt with PCP today. Planning labs at that apt per patient.    Allergies  Allergen Reactions  . Peanuts [Peanut Oil] Shortness Of Breath  Immunization History  Administered Date(s) Administered  . Influenza-Unspecified 08/16/2015, 09/02/2016  . Pneumococcal Polysaccharide-23 05/28/2016    Past Medical History:  Diagnosis Date  . Asthma   . Hypertension   . OSA on CPAP     Tobacco History: Social History   Tobacco Use  Smoking Status Former Smoker  . Packs/day: 0.50  . Years: 16.00  . Pack years: 8.00  . Types: Cigarettes  . Last attempt to quit: 02/10/2017  . Years since quitting: 1.2  Smokeless Tobacco Never Used   Counseling given: Not Answered   Outpatient Medications Prior  to Visit  Medication Sig Dispense Refill  . albuterol (PROVENTIL) (2.5 MG/3ML) 0.083% nebulizer solution Take 3 mLs (2.5 mg total) by nebulization every 6 (six) hours as needed for wheezing or shortness of breath. 75 mL 2  . amLODipine (NORVASC) 10 MG tablet Take 1 tablet (10 mg total) by mouth daily. 30 tablet 2  . anastrozole (ARIMIDEX) 1 MG tablet Take 1 tablet by mouth daily.  10  . clomiPHENE (CLOMID) 50 MG tablet Take 50 mg by mouth daily.    Marland Kitchen levocetirizine (XYZAL) 5 MG tablet Take 1 tablet by mouth daily.    . montelukast (SINGULAIR) 10 MG tablet Take 1 tablet (10 mg total) by mouth at bedtime. 30 tablet 11  . omeprazole (PRILOSEC) 20 MG capsule Take 1 capsule (20 mg total) by mouth daily. 30 capsule 4  . telmisartan (MICARDIS) 40 MG tablet Take two tablets (80mg ) by mouth daily 60 tablet 2  . telmisartan (MICARDIS) 80 MG tablet Take 1 tablet (80 mg total) by mouth daily. 30 tablet 11  . albuterol (PROVENTIL HFA;VENTOLIN HFA) 108 (90 BASE) MCG/ACT inhaler Inhale 2 puffs into the lungs every 6 (six) hours as needed. Patient used this medication for wheezing and shortness of breath.    Marland Kitchen amLODipine (NORVASC) 10 MG tablet Take 10 mg by mouth daily.    . budesonide-formoterol (SYMBICORT) 160-4.5 MCG/ACT inhaler Inhale 2 puffs into the lungs every 12 (twelve) hours. 1 Inhaler 0  . budesonide-formoterol (SYMBICORT) 80-4.5 MCG/ACT inhaler Take 2 puffs first thing in am and then another 2 puffs about 12 hours later. 1 Inhaler 12   No facility-administered medications prior to visit.    Review of Systems  Review of Systems  Constitutional: Negative.   HENT: Positive for postnasal drip.   Respiratory: Positive for chest tightness and shortness of breath. Negative for cough, wheezing and stridor.        SOB when lying flat  Cardiovascular: Negative.    Physical Exam  BP 120/78   Pulse 85   Ht 5\' 10"  (1.778 m)   Wt 299 lb 3.2 oz (135.7 kg)   SpO2 96%   BMI 42.93 kg/m  Physical Exam    Constitutional: He is oriented to person, place, and time. He appears well-developed and well-nourished.  Overweight  HENT:  Head: Normocephalic and atraumatic.  Eyes: Pupils are equal, round, and reactive to light. EOM are normal.  Neck: Normal range of motion. Neck supple.  Cardiovascular: Normal rate, regular rhythm, normal heart sounds and intact distal pulses.  Pulmonary/Chest: Effort normal and breath sounds normal. No respiratory distress. He has no wheezes.  LSC, mild upper airway exp wheeze   Abdominal: Soft. Bowel sounds are normal. There is no tenderness.  Neurological: He is alert and oriented to person, place, and time.  Skin: Skin is warm and dry. No rash noted. No erythema.  Psychiatric: He has a normal mood  and affect. His behavior is normal. Judgment normal.     Lab Results:  CBC    Component Value Date/Time   WBC 10.4 01/26/2018 1220   RBC 5.64 01/26/2018 1220   HGB 15.9 01/26/2018 1220   HCT 48.0 01/26/2018 1220   PLT 260.0 01/26/2018 1220   MCV 85.1 01/26/2018 1220   MCH 29.0 02/22/2017 0743   MCHC 33.2 01/26/2018 1220   RDW 15.2 01/26/2018 1220   LYMPHSABS 3.9 01/26/2018 1220   MONOABS 0.9 01/26/2018 1220   EOSABS 0.9 (H) 01/26/2018 1220   BASOSABS 0.1 01/26/2018 1220    BMET    Component Value Date/Time   NA 140 02/22/2017 0743   K 3.8 02/22/2017 0743   CL 105 02/22/2017 0743   CO2 26 02/22/2017 0743   GLUCOSE 98 02/22/2017 0743   BUN 15 02/22/2017 0743   CREATININE 1.37 (H) 02/22/2017 0743   CALCIUM 8.9 02/22/2017 0743   GFRNONAA >60 02/22/2017 0743   GFRAA >60 02/22/2017 0743    BNP No results found for: BNP  ProBNP No results found for: PROBNP  Imaging: No results found.   Assessment & Plan:  37 year old male, history persistent asthma. Patient presents today with complaints of shortness of breath and chest tightness, he experiences symptoms when lying flat and sitting.  He also complains of rib pain with deep breath. No symptoms  with exercise.  Some improvement with rescue inhaler.   Shortness of breath at night unlikely related to sleep apnea because patient has been compliant with CPAP use.  He does have an appointment with Carilyn Goodpasture for OSA management today. BP has been elevated over the last few months, he continues Micardis and Norvasc 10mg .  Patient reports that he has been taking 2 tabs of Norvasc over the last 3 days due to elevated blood pressure.  BP today in office 140/72, recheck after 5 minutes 120/78 large cuff.  He is  DOT exam today. Recommend checking d-dimer with labs today at PCP. Low suspicion for PE no other clinically findings to suggest this.  Chest x-ray not warranted at this time, patient does not complain of cough, congestion or fever. Will treat for mild acute asthma exacerbation, FENO elevated at 26.  Plan to check echocardiogram due to reported orthopnea. Patient will discuss cardiology referral with PCP.    Asthma exacerbation - Complains of sob and chest tightness  - FENO 26 - RX prednisone x 6 days - Continues Symbicort 160 - Recommend PCP check D-dimer with labs today   Orthopnea - Experiencing episodes of orthopnea when lying flat and at night  - Check Echocardiogram   Mild persistent asthma - Needs to return for full PFTs, scheduled for September   Essential hypertension - ACEi changed to ARB in April d/t cough - Continues Micardis 80mg  daily and Norvasc 10mg  daily  - BP improved, however, patient reports taking 20mg  Norvasc for the last 3 days d/t elevated bp readings.  - Discouraged patient taking more medication than prescribed. He will fu with PCP today.    Rib pain on right side - Complains of occasional rib pain with deep breath - No trauma or fall. No clinical signs of PNA, LSC and 02 96% RA. Denies fever, cough or mucus production. Symptoms likely r/t to asthma exac - Recommend checking d-dimer, low suspicion PE      Glenford Bayley, NP 05/19/2018

## 2018-05-19 NOTE — Progress Notes (Signed)
Chart and office note reviewed in detail  > agree with a/p as outlined    

## 2018-05-19 NOTE — Assessment & Plan Note (Addendum)
-   Complains of occasional rib pain with deep breath - No trauma or fall. No clinical signs of PNA, LSC and 02 96% RA. Denies fever, cough or mucus production. Symptoms likely r/t to asthma exac - Recommend checking d-dimer, low suspicion PE

## 2018-05-19 NOTE — Assessment & Plan Note (Addendum)
-   Complains of sob and chest tightness  - FENO 26 - RX prednisone x 6 days - Continues Symbicort 160 - Recommend PCP check D-dimer with labs today

## 2018-05-19 NOTE — Assessment & Plan Note (Signed)
-   ACEi changed to ARB in April d/t cough - Continues Micardis 80mg  daily and Norvasc 10mg  daily  - BP improved, however, patient reports taking 20mg  Norvasc for the last 3 days d/t elevated bp readings.  - Discouraged patient taking more medication than prescribed. He will fu with PCP today.

## 2018-05-24 ENCOUNTER — Other Ambulatory Visit: Payer: Self-pay

## 2018-05-24 ENCOUNTER — Ambulatory Visit (HOSPITAL_COMMUNITY): Payer: No Typology Code available for payment source | Attending: Cardiology

## 2018-05-24 DIAGNOSIS — G4733 Obstructive sleep apnea (adult) (pediatric): Secondary | ICD-10-CM | POA: Diagnosis not present

## 2018-05-24 DIAGNOSIS — R0601 Orthopnea: Secondary | ICD-10-CM | POA: Diagnosis present

## 2018-05-24 DIAGNOSIS — E785 Hyperlipidemia, unspecified: Secondary | ICD-10-CM | POA: Insufficient documentation

## 2018-05-24 DIAGNOSIS — I10 Essential (primary) hypertension: Secondary | ICD-10-CM | POA: Insufficient documentation

## 2018-05-24 DIAGNOSIS — R011 Cardiac murmur, unspecified: Secondary | ICD-10-CM | POA: Insufficient documentation

## 2018-05-25 ENCOUNTER — Other Ambulatory Visit: Payer: Self-pay

## 2018-05-25 MED ORDER — BUDESONIDE-FORMOTEROL FUMARATE 160-4.5 MCG/ACT IN AERO: 2.0000 | INHALATION_SPRAY | Freq: Two times a day (BID) | RESPIRATORY_TRACT | 12 refills | Status: DC

## 2018-06-04 ENCOUNTER — Ambulatory Visit (INDEPENDENT_AMBULATORY_CARE_PROVIDER_SITE_OTHER): Payer: No Typology Code available for payment source | Admitting: Internal Medicine

## 2018-06-04 ENCOUNTER — Encounter: Payer: Self-pay | Admitting: Internal Medicine

## 2018-06-04 VITALS — BP 120/82 | HR 87 | Ht 70.0 in | Wt 295.2 lb

## 2018-06-04 DIAGNOSIS — K219 Gastro-esophageal reflux disease without esophagitis: Secondary | ICD-10-CM | POA: Diagnosis not present

## 2018-06-04 DIAGNOSIS — I10 Essential (primary) hypertension: Secondary | ICD-10-CM | POA: Diagnosis not present

## 2018-06-04 DIAGNOSIS — J4530 Mild persistent asthma, uncomplicated: Secondary | ICD-10-CM

## 2018-06-04 NOTE — Progress Notes (Signed)
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 requiring as needed inhalers but only late summer / early fall and not necessarily before ex and able to do sports football runny back/ [place] and started smoking p high school until 02/2017 after bad asthma attack > admit [facility] but even p quit smoking on advair still having chest tightness daily and having to use more and more ventolin to up to 4 x daily and noct saba as well since mid march 2019 and even using nebulizer up to once day while maintain on advair and ACEi    History of Present Illness  01/26/2018 1st [facility] office visit/ Anh Bigos   Chief Complaint  Patient presents with  . Pulmonary Consult    Self referral for Asthma. He states he was dxed with Asthma age 77. He states that his breathing has been worse "for years" and also c/o occ chest tightness.  He uses his albuterol inhaler 3-4 x per day.  He uses his neb approx 2 x per wk.  He uses CPAP. He occ wakes up in the night and has to use his albuterol.     had not used saba  Prior to ov / cough is mucoid/ gen chest tightness even when not actively wheezing  rec Stop lisinopril and start micardis 80 mg one daily in its place and your breathing should gradually normalize Plan A = Automatic =  Symbicort 80 Take 2 puffs first thing in am and then another 2 puffs about 12 hours later.  Work on inhaler technique:  Plan B = Backup Only use your albuterol as a rescue medication  Plan C = Crisis - only use your albuterol nebulizer if you first try Plan B and it fails to help > ok to use the nebulizer up to every 4 hours   Pease schedule a follow up office visit in 4 weeks, sooner if needed with pfts on return (hold all your inhalers that day if possible)       06/04/2018 acute extended ov/Deserea Bordley re:  Midline chest discomfort  Chief Complaint  Patient presents with  .  Acute Visit    Pressure/ chest pain that comes and goes SOB for 2months.   100% better p last ov for sev months on symb 80 2 bid/ no need albuterol  New midline cp in June 2019  Comes and goes  since then  worse lying down, sometimes radiates to back   20 min at most, no change with ex and minimally worse with dep breath/ better sitting up  Regurgitating food x years every meals - unusual diet where fasts x 24 h No better  So far with omeprazole 20 mg daily but not taking ac  Still on symb 160 2bid and singulair and no need for saba    No obvious day to day or daytime variability or assoc excess/ purulent sputum or mucus plugs or hemoptysis or  subjective wheeze or overt sinus  symptoms.    . Also denies any obvious fluctuation of symptoms with weather or environmental changes or other aggravating or alleviating factors except as outlined above   No unusual exposure hx or h/o childhood pna/ asthma or knowledge of premature birth.  Current Allergies, Complete Past Medical History, Past Surgical History, Family History, and Social History were reviewed in [employer]  electronic medical record.  ROS  The following are not active complaints unless bolded Hoarseness, sore throat, dysphagia, dental problems, itching, sneezing,  nasal congestion or discharge of excess mucus or purulent secretions, ear ache,   fever, chills, sweats, unintended wt loss or wt gain, classically pleuritic or exertional cp,  orthopnea pnd or arm/hand swelling  or leg swelling, presyncope, palpitations, abdominal pain, anorexia, nausea, vomiting, diarrhea  or change in bowel habits or change in bladder habits, change in stools or change in urine, dysuria, hematuria,  rash, arthralgias, visual complaints, headache, numbness, weakness or ataxia or problems with walking or coordination,  change in mood or  memory.        Current Meds  Medication Sig  . albuterol (PROVENTIL HFA;VENTOLIN HFA) 108 (90 Base) MCG/ACT inhaler  Inhale 2 puffs into the lungs every 6 (six) hours as needed. Patient used this medication for wheezing and shortness of breath.  Marland Kitchen. albuterol (PROVENTIL) (2.5 MG/3ML) 0.083% nebulizer solution Take 3 mLs (2.5 mg total) by nebulization every 6 (six) hours as needed for wheezing or shortness of breath.  Marland Kitchen. amLODipine (NORVASC) 10 MG tablet Take 1 tablet (10 mg total) by mouth daily.  Marland Kitchen. anastrozole (ARIMIDEX) 1 MG tablet Take 1 tablet by mouth daily.  . budesonide-formoterol (SYMBICORT) 160-4.5 MCG/ACT inhaler Inhale 2 puffs into the lungs every 12 (twelve) hours.  . clomiPHENE (CLOMID) 50 MG tablet Take 50 mg by mouth daily.  Marland Kitchen. levocetirizine (XYZAL) 5 MG tablet Take 1 tablet by mouth daily.  . metoprolol succinate (TOPROL-XL) 50 MG 24 hr tablet Take 1 tablet (50 mg total) by mouth daily. Take with or immediately following a meal.  . montelukast (SINGULAIR) 10 MG tablet Take 1 tablet (10 mg total) by mouth at bedtime.  . pantoprazole (PROTONIX) 40 MG tablet Take 1 tablet (40 mg total) by mouth daily.  . sertraline (ZOLOFT) 50 MG tablet Take 1 tablet (50 mg total) by mouth daily. Take 1/2 tab daily for first 2 weeks.  Marland Kitchen. telmisartan (MICARDIS) 40 MG tablet Take two tablets (80mg ) by mouth daily              Objective:   Physical Exam   pleasant amb bm nad    06/04/2018       295   01/26/18 296 lb 6.4 oz (134.4 kg)  02/21/17 263 lb 11.2 oz (119.6 kg)  09/29/16 275 lb (124.7 kg)     Vital signs reviewed - Note on arrival 02 sats  97 % on RA     HEENT: nl dentition, turbinates bilaterally, and oropharynx. Nl external ear canals without cough reflex   NECK :  without JVD/Nodes/TM/ nl carotid upstrokes bilaterally   LUNGS: no acc muscle use,  Nl contour chest which is clear to A and P bilaterally without cough on insp or exp maneuvers   CV:  RRR  no s3 or murmur or increase in P2, and no edema   ABD:  soft and nontender with nl inspiratory excursion in the supine position. No bruits  or organomegaly appreciated, bowel sounds nl  MS:  Nl gait/ ext warm without deformities, calf tenderness, cyanosis or clubbing No obvious joint restrictions   SKIN: warm and dry without lesions    NEURO:  alert, approp, nl sensorium with  no motor or cerebellar deficits apparent.    ekg p fast walk x 185 ft x 3 laps =  Nsr/ diffuse early repol changes only  Assessment:

## 2018-06-04 NOTE — Patient Instructions (Addendum)
Take omeprazole x 2  And take 30 min  Before bfast and supper and replace with protonix 40 mg Take 30- 60 min before your first and last meals of the day until your next appt   GERD (REFLUX)  is an extremely common cause of respiratory symptoms just like yours , many times with no obvious heartburn at all.    It can be treated with medication, but also with lifestyle changes including elevation of the head of your bed (ideally with 6 inch  bed blocks),  Smoking cessation, avoidance of late meals, excessive alcohol, and avoid fatty foods, chocolate, peppermint, colas, red wine, and acidic juices such as orange juice.  NO MINT OR MENTHOL PRODUCTS SO NO COUGH DROPS   USE SUGARLESS CANDY INSTEAD (Jolley ranchers or Stover's or Life Savers) or even ice chips will also do - the key is to swallow to prevent all throat clearing. NO OIL BASED VITAMINS - use powdered substitutes.   Please see patient coordinator before you leave today  to schedule  DgEsophagus    Keep your appt to see me in September 2019  But please bring all medications with you

## 2018-06-06 ENCOUNTER — Encounter: Payer: Self-pay | Admitting: Internal Medicine

## 2018-06-06 NOTE — Assessment & Plan Note (Addendum)
DgEs ordered   Classic noct cp in setting of very unusual diet c/w esophagitis and not cardiac or pulmonary source  rec  Max rx for gerd with diet and lifestyle changes  and f/u with DgEs  GI f/u prn findings on dges or refractory symptoms   I had an extended discussion with the patient reviewing all relevant studies completed to date and  lasting 15 to 20 minutes of a 25 minute acute  visit  Addressing a new problem  Each maintenance medication was reviewed in detail including most importantly the difference between maintenance and prns and under what circumstances the prns are to be triggered using an action plan format that is not reflected in the computer generated alphabetically organized AVS.     Please see AVS for specific instructions unique to this visit that I personally wrote and verbalized to the the pt in detail and then reviewed with pt  by my nurse highlighting any  changes in therapy recommended at today's visit to their plan of care.

## 2018-06-06 NOTE — Assessment & Plan Note (Signed)
FENO 01/26/2018  =   29  - 01/26/2018  After extensive coaching inhaler device  effectiveness =    90% try symbicort 80 2bid  - Allergy profile 01/26/18  >  Eos 0.9/  IgE  112  Ragweed, trees, grass   Feno 04/20/18 - 33 Trial of Symbicort 160 started on 04/20/18 Trial of singulair 04/20/18  FENO 05/19/18- 26 RX prednisone x 6 days Continue symbicort 160, inhaler device  effectiveness =    90%   ECHO for orthopnea > done 05/24/18: - Normal LV size with EF 55-60%. Normal diastolic function. Normal   RV size and systolic function. No significant valvular   abnormalities.   All goals of chronic asthma control met including optimal function and elimination of symptoms with minimal need for rescue therapy.  Contingencies discussed in full including contacting this office immediately if not controlling the symptoms using the rule of two's.     I suspect his present symptoms are all gerd related (see separate a/p)   No change in resp meds needed .

## 2018-06-06 NOTE — Assessment & Plan Note (Signed)
Adequate control on present rx, reviewed in detail with pt > no change in rx needed   

## 2018-06-11 ENCOUNTER — Ambulatory Visit (HOSPITAL_COMMUNITY)
Admission: RE | Admit: 2018-06-11 | Discharge: 2018-06-11 | Disposition: A | Discharge status: Home / Self Care | Payer: No Typology Code available for payment source | Source: Ambulatory Visit | Attending: Internal Medicine | Admitting: Internal Medicine

## 2018-06-11 DIAGNOSIS — K219 Gastro-esophageal reflux disease without esophagitis: Secondary | ICD-10-CM | POA: Insufficient documentation

## 2018-06-11 DIAGNOSIS — K449 Diaphragmatic hernia without obstruction or gangrene: Secondary | ICD-10-CM | POA: Insufficient documentation

## 2018-06-15 ENCOUNTER — Telehealth: Payer: Self-pay

## 2018-06-15 ENCOUNTER — Other Ambulatory Visit: Payer: Self-pay | Admitting: Internal Medicine

## 2018-06-15 ENCOUNTER — Encounter: Payer: Self-pay | Admitting: Gastroenterology

## 2018-06-15 DIAGNOSIS — K219 Gastro-esophageal reflux disease without esophagitis: Secondary | ICD-10-CM

## 2018-06-15 NOTE — Progress Notes (Signed)
Spoke with pt and notified of results per Dr. Wert. Pt verbalized understanding and denied any questions. 

## 2018-06-15 NOTE — Telephone Encounter (Signed)
Copied from CRM [id number]. Topic: Referral - Medical Records >> Jun 15, 2018  2:59 PM Lorayne Bender wrote: Reason for CRM:   [facility] with [facility] calling.  States they received a referral for low Testerone, but didn't get any Testerone labs.   Fax: [phone] Jeanice Lim can be reached at [phone]

## 2018-06-16 NOTE — Telephone Encounter (Signed)
There are no testosterone labs.

## 2018-06-16 NOTE — Telephone Encounter (Signed)
I did call the patient who informed me that he was seen at [facility] before ever seeing PCP. They have his previous labs. This referral was for a followup appt that he missed and was trying to get back in. Called back [facility] and explained situation ...  No problem they do have his records.

## 2018-06-16 NOTE — Telephone Encounter (Signed)
He has a hx of low T on Arimdex. He established with me because insurance dictates he have a referral to a specialist. We may have to have him dig up some old records. TY.

## 2018-06-16 NOTE — Telephone Encounter (Signed)
I did not see testosterone labs?

## 2018-06-21 ENCOUNTER — Ambulatory Visit (INDEPENDENT_AMBULATORY_CARE_PROVIDER_SITE_OTHER): Payer: No Typology Code available for payment source | Admitting: Internal Medicine

## 2018-06-21 ENCOUNTER — Encounter: Payer: Self-pay | Admitting: Internal Medicine

## 2018-06-21 VITALS — BP 122/82 | HR 62 | Ht 70.0 in | Wt 294.0 lb

## 2018-06-21 DIAGNOSIS — J4530 Mild persistent asthma, uncomplicated: Secondary | ICD-10-CM

## 2018-06-21 DIAGNOSIS — K219 Gastro-esophageal reflux disease without esophagitis: Secondary | ICD-10-CM | POA: Diagnosis not present

## 2018-06-21 LAB — PULMONARY FUNCTION TEST
DL/VA % pred: 122 %
DL/VA: 5.7 ml/min/mmHg/L
DLCO UNC % PRED: 134 %
DLCO UNC: 42.57 ml/min/mmHg
FEF 25-75 PRE: 2.47 L/s
FEF 25-75 Post: 2.8 L/sec
FEF2575-%Change-Post: 13 %
FEF2575-%Pred-Post: 73 %
FEF2575-%Pred-Pre: 64 %
FEV1-%Change-Post: 4 %
FEV1-%PRED-PRE: 101 %
FEV1-%Pred-Post: 105 %
FEV1-POST: 3.77 L
FEV1-Pre: 3.62 L
FEV1FVC-%Change-Post: 4 %
FEV1FVC-%Pred-Pre: 86 %
FEV6-%CHANGE-POST: 0 %
FEV6-%PRED-POST: 119 %
FEV6-%PRED-PRE: 118 %
FEV6-POST: 5.08 L
FEV6-PRE: 5.06 L
FEV6FVC-%CHANGE-POST: 1 %
FEV6FVC-%PRED-POST: 101 %
FEV6FVC-%PRED-PRE: 100 %
FVC-%Change-Post: 0 %
FVC-%Pred-Post: 116 %
FVC-%Pred-Pre: 117 %
FVC-Post: 5.08 L
FVC-Pre: 5.11 L
POST FEV1/FVC RATIO: 74 %
POST FEV6/FVC RATIO: 100 %
PRE FEV6/FVC RATIO: 99 %
Pre FEV1/FVC ratio: 71 %
RV % PRED: 90 %
RV: 1.58 L
TLC % PRED: 101 %
TLC: 6.93 L

## 2018-06-21 NOTE — Progress Notes (Signed)
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 requiring as needed inhalers but only late summer / early fall and not necessarily before ex and able to do sports football runny back/ LB and started smoking p high school until 02/2017 after bad asthma attack > admit cone but even p quit smoking on advair still having chest tightness daily and having to use more and more ventolin to up to 4 x daily and noct saba as well since mid march 2019 and even using nebulizer up to once day while maintain on advair and ACEi    History of Present Illness  01/26/2018 1st [facility] office visit/ Wert   Chief Complaint  Patient presents with  . Pulmonary Consult    Self referral for Asthma. He states he was dxed with Asthma age 37. He states that his breathing has been worse "for years" and also c/o occ chest tightness.  He uses his albuterol inhaler 3-4 x per day.  He uses his neb approx 2 x per wk.  He uses CPAP. He occ wakes up in the night and has to use his albuterol.     had not used saba  Prior to ov / cough is mucoid/ gen chest tightness even when not actively wheezing  rec Stop lisinopril and start micardis 80 mg one daily in its place and your breathing should gradually normalize Plan A = Automatic =  Symbicort 80 Take 2 puffs first thing in am and then another 2 puffs about 12 hours later.  Work on inhaler technique:  Plan B = Backup Only use your albuterol as a rescue medication  Plan C = Crisis - only use your albuterol nebulizer if you first try Plan B and it fails to help > ok to use the nebulizer up to every 4 hours   Pease schedule a follow up office visit in 4 weeks, sooner if needed with pfts on return (hold all your inhalers that day if possible)       06/04/2018 acute extended ov/Wert re:  Midline chest discomfort June 2019  Chief Complaint  Patient presents  with  . Acute Visit    Pressure/ chest pain that comes and goes SOB for 2months.   100% better p last ov for sev months on symb 80 2 bid/ no need albuterol  New midline cp in June 2019  Comes and goes  since then  worse lying down, sometimes radiates to back   20 min at most, no change with ex and minimally worse with dep breath/ better sitting up  Regurgitating food x years every meals - unusual diet where fasts x 24 h No better  So far with omeprazole 20 mg daily but not taking ac  Still on symb 160 2bid and singulair and no need for saba  rec Take omeprazole x 2  And take 30 min  Before bfast and supper and replace with protonix 40 mg Take 30- 60 min before your first and last meals of the day until your next appt  GERD det    schedule  DgEsophagus > mild HH only  Keep your appt to see me in September 2019  But please bring all medications with you      06/21/2018  f/u ov/Wert re: mild asthma 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 Follow-up    2 month follow up with PFT results.   Dyspnea:  Good ex tol = eliptical x 30 min  Cough: none Sleeping: able to sleep flat SABA use: none  02: none     No obvious day to day or daytime variability or assoc excess/ purulent sputum or mucus plugs or hemoptysis or cp or chest tightness, subjective wheeze or overt sinus   symptoms.   sleeping ok as above  without nocturnal  or early am exacerbation  of respiratory  c/o's or need for noct saba. Also denies any obvious fluctuation of symptoms with weather or environmental changes or other aggravating or alleviating factors except as outlined above   No unusual exposure hx or h/o childhood pna/ asthma or knowledge of premature birth.  Current Allergies, Complete Past Medical History, Past Surgical History, Family History, and Social History were reviewed in [employer] record.  ROS  The following are not active complaints unless  bolded Hoarseness, sore throat, dysphagia, dental problems, itching, sneezing,  nasal congestion or discharge of excess mucus or purulent secretions, ear ache,   fever, chills, sweats, unintended wt loss or wt gain, classically pleuritic or exertional cp,  orthopnea pnd or arm/hand swelling  or leg swelling, presyncope, palpitations, abdominal pain, anorexia, nausea, vomiting, diarrhea  or change in bowel habits or change in bladder habits, change in stools or change in urine, dysuria, hematuria,  rash, arthralgias, visual complaints, headache, numbness, weakness or ataxia or problems with walking or coordination,  change in mood or  memory.        Current Meds  Medication Sig  . albuterol (PROVENTIL HFA;VENTOLIN HFA) 108 (90 Base) MCG/ACT inhaler Inhale 2 puffs into the lungs every 6 (six) hours as needed. Patient used this medication for wheezing and shortness of breath.  Marland Kitchen albuterol (PROVENTIL) (2.5 MG/3ML) 0.083% nebulizer solution Take 3 mLs (2.5 mg total) by nebulization every 6 (six) hours as needed for wheezing or shortness of breath.  Marland Kitchen amLODipine (NORVASC) 10 MG tablet Take 1 tablet (10 mg total) by mouth daily.  Marland Kitchen anastrozole (ARIMIDEX) 1 MG tablet Take 1 tablet by mouth daily.  . budesonide-formoterol (SYMBICORT) 160-4.5 MCG/ACT inhaler Inhale 2 puffs into the lungs every 12 (twelve) hours.  . clomiPHENE (CLOMID) 50 MG tablet Take 50 mg by mouth daily.  Marland Kitchen levocetirizine (XYZAL) 5 MG tablet Take 1 tablet by mouth daily.  . metoprolol succinate (TOPROL-XL) 50 MG 24 hr tablet Take 1 tablet (50 mg total) by mouth daily. Take with or immediately following a meal.  . montelukast (SINGULAIR) 10 MG tablet Take 1 tablet (10 mg total) by mouth at bedtime.  . pantoprazole (PROTONIX) 40 MG tablet Take 1 tablet (40 mg total) by mouth daily.  . sertraline (ZOLOFT) 50 MG tablet Take 1 tablet (50 mg total) by mouth daily. Take 1/2 tab daily for first 2 weeks.  Marland Kitchen telmisartan (MICARDIS) 40 MG tablet Take  two tablets (80mg ) by mouth daily                 Objective:   Physical Exam  Pleasant amb bm nad   06/21/2018         294  06/04/2018       295   01/26/18 296 lb 6.4 oz (134.4 kg)  02/21/17 263 lb 11.2 oz (119.6 kg)  09/29/16 275 lb (124.7 kg)     Vital signs reviewed - Note on arrival 02  sats  98% on RA     HEENT: nl dentition, turbinates bilaterally, and oropharynx. Nl external ear canals without cough reflex   NECK :  without JVD/Nodes/TM/ nl carotid upstrokes bilaterally   LUNGS: no acc muscle use,  Nl contour chest which is clear to A and P bilaterally without cough on insp or exp maneuvers   CV:  RRR  no s3 or murmur or increase in P2, and no edema   ABD:  soft and nontender with nl inspiratory excursion in the supine position. No bruits or organomegaly appreciated, bowel sounds nl  MS:  Nl gait/ ext warm without deformities, calf tenderness, cyanosis or clubbing No obvious joint restrictions   SKIN: warm and dry without lesions    NEURO:  alert, approp, nl sensorium with  no motor or cerebellar deficits apparent.           Assessment:

## 2018-06-21 NOTE — Patient Instructions (Addendum)
Your lung function is normal now so no change in recommendations   Please schedule a follow up visit in 6  months but call sooner if needed

## 2018-06-21 NOTE — Progress Notes (Signed)
Patient completed full PFT today. 

## 2018-06-22 ENCOUNTER — Encounter: Payer: Self-pay | Admitting: Internal Medicine

## 2018-06-22 NOTE — Assessment & Plan Note (Signed)
DgEs  06/11/18  Tiny hiatal hernia. Otherwise negative double-contrast esophagram.  Continues to have dsypepsia,upper abd pain > to see GI this week

## 2018-06-22 NOTE — Assessment & Plan Note (Signed)
Body mass index is 42.18 kg/m.  -  trending no change  Lab Results  Component Value Date   TSH 0.397 02/22/2017     Contributing to gerd risk/ doe/reviewed the need and the process to achieve and maintain neg calorie balance > defer f/u primary care including intermittently monitoring thyroid status

## 2018-06-22 NOTE — Assessment & Plan Note (Signed)
FENO 01/26/2018  =   29  - 01/26/2018  After extensive coaching inhaler device  effectiveness =    90% try symbicort 80 2bid  - Allergy profile 01/26/18  >  Eos 0.9/  IgE  112  Ragweed, trees, grass  Feno 04/20/18 - 33 Increase Symbicort 160 started on 04/20/18 Trial of singulair 04/20/18 FENO 05/19/18- 26 Continue symbicort 160, inhaler device  effectiveness =    90%   - PFT's  06/21/2018  FEV1 3/77 (105 % ) ratio 74  p 4 % improvement from saba p symb 160 prior to study with DLCO  134 % corrects to 122  % for alv volume        All goals of chronic asthma control met including optimal function and elimination of symptoms with minimal need for rescue therapy.  Contingencies discussed in full including contacting this office immediately if not controlling the symptoms using the rule of two's.     - The proper method of use, as well as anticipated side effects, of a metered-dose inhaler are discussed and demonstrated to the patient.    F/u 6 months, consider step down back to 80 2bid then    I had an extended discussion with the patient reviewing all relevant studies completed to date and  lasting 15 to 20 minutes of a 25 minute visit    See device teaching which extended face to face time for this visit.  Each maintenance medication was reviewed in detail including emphasizing most importantly the difference between maintenance and prns and under what circumstances the prns are to be triggered using an action plan format that is not reflected in the computer generated alphabetically organized AVS which I have not found useful in most complex patients, especially with respiratory illnesses  Please see AVS for specific instructions unique to this visit that I personally wrote and verbalized to the the pt in detail and then reviewed with pt  by my nurse highlighting any  changes in therapy recommended at today's visit to their plan of care.

## 2018-06-23 ENCOUNTER — Telehealth: Payer: Self-pay | Admitting: Family Medicine

## 2018-06-23 NOTE — Telephone Encounter (Signed)
Copied from CRM [id number]. Topic: Quick Communication - See Telephone Encounter >> Jun 23, 2018  4:49 PM Valentina Lucks wrote: CRM for notification. See Telephone encounter for: 06/23/18.   Pt came in office stating Pulmonary doctor requested for pt to see GI doctor, pt wanting to know if Dr Carmelia Roller can send a referral to Dr Doristine Locks GI from [facility], since pt already has an appt on Sept 16, 2019. Please advise.

## 2018-06-24 MED FILL — TELMISARTAN 40 MG TABS: 40 | 30 days supply | Qty: 60 | Fill #0

## 2018-06-24 MED FILL — SERTRALINE HCL 50 MG TABLET: 50 | 30 days supply | Qty: 30 | Fill #0

## 2018-06-24 MED FILL — MONTELUKAST SOD 10 MG TAB: 10 | 30 days supply | Qty: 30 | Fill #0

## 2018-06-24 NOTE — Telephone Encounter (Signed)
It looks like this was already done on 9/3? OK to do again if needed. TY.

## 2018-06-24 NOTE — Telephone Encounter (Signed)
Referral done

## 2018-06-28 ENCOUNTER — Encounter: Payer: Self-pay | Admitting: Gastroenterology

## 2018-06-28 ENCOUNTER — Ambulatory Visit (INDEPENDENT_AMBULATORY_CARE_PROVIDER_SITE_OTHER): Payer: No Typology Code available for payment source | Admitting: Gastroenterology

## 2018-06-28 VITALS — BP 142/86 | HR 68 | Ht 70.0 in | Wt 295.0 lb

## 2018-06-28 DIAGNOSIS — K219 Gastro-esophageal reflux disease without esophagitis: Secondary | ICD-10-CM

## 2018-06-28 MED FILL — VENTOLIN HFA 90 MCG INHALER: 108 (90 BAS | 25 days supply | Qty: 18 | Fill #0

## 2018-06-28 NOTE — Patient Instructions (Signed)
If you are age 265 or older, your body mass index should be between 23-30. Your Body mass index is 42.33 kg/m. If this is out of the aforementioned range listed, please consider follow up with your Primary Care Provider.  If you are age 37 or younger, your body mass index should be between 19-25. Your Body mass index is 42.33 kg/m. If this is out of the aformentioned range listed, please consider follow up with your Primary Care Provider.   You have been scheduled for an endoscopy. Please follow written instructions given to you at your visit today. If you use inhalers (even only as needed), please bring them with you on the day of your procedure. Your physician has requested that you go to www.startemmi.com and enter the access code given to you at your visit today. This web site gives a general overview about your procedure. However, you should still follow specific instructions given to you by our office regarding your preparation for the procedure.  Change the way you are taking your Protonix. Take Protonix 40mg  daily 30-60 minutes before lunch.  It was a pleasure to see you today!  Vito Cirigliano, D.O.

## 2018-06-28 NOTE — Progress Notes (Signed)
Chief Complaint: GERD   Referring Provider:     Sandrea HughsMichael Wert, MD    HPI:     Charles Collier is a 37 y.o. male referred to the [facility]astroenterology Clinic for evaluation of reflux symptoms.  He states he has had regurgitation for years. More recently with substernal chest pressure- negative evaluation by Pulmonary to include TTE. He has intermittent pyrosis and dyspepsia.  Currently changed changed to Protonix 40 mg PO daily (although was prescribed as twice daily by Dr. Sherene SiresWert) from Prilosec.  Change in PPI has not changed regurgitation, but HB/dyspepsia has largely resolved. No odynophagia or dysphagia. Regurgitation worse post prandial, and can be with any types of food, lasting 30 minutes. +belching with regurgitation episodes. No nocturnal sxs and no waterbrash. Had barium swallow done on 8/30 which was notable for tiny HH but otherwise a normal study and regurgitation not noted on exam.  Previous EGD.  No known family history of CRC, GI malignancy, liver disease, pancreatic disease, or IBD.    Past Medical History:  Diagnosis Date  . Arthritis   . Asthma   . GERD (gastroesophageal reflux disease)   . Heart murmur   . Hyperlipidemia   . Hypertension   . OSA on CPAP      Past Surgical History:  Procedure Laterality Date  . ANTERIOR CRUCIATE LIGAMENT REPAIR     Family History  Problem Relation Age of Onset  . Hypertension Mother   . Hypertension Sister   . Hypertension Maternal Grandmother   . Hyperlipidemia Maternal Grandmother   . Cancer Maternal Grandfather        prostate  . Hearing loss Maternal Grandfather   . Hypertension Maternal Grandfather   . Hypertension Paternal Grandmother   . Hypertension Paternal Grandfather   . Colon cancer Neg Hx    Social History   Tobacco Use  . Smoking status: Former Smoker    Packs/day: 0.50    Years: 16.00    Pack years: 8.00    Types: Cigarettes    Last attempt to quit: 02/10/2017    Years since quitting: 1.3   . Smokeless tobacco: Never Used  Substance Use Topics  . Alcohol use: Yes  . Drug use: No   Current Outpatient Medications  Medication Sig Dispense Refill  . albuterol (PROVENTIL HFA;VENTOLIN HFA) 108 (90 Base) MCG/ACT inhaler Inhale 2 puffs into the lungs every 6 (six) hours as needed. Patient used this medication for wheezing and shortness of breath. 1 Inhaler 3  . albuterol (PROVENTIL) (2.5 MG/3ML) 0.083% nebulizer solution Take 3 mLs (2.5 mg total) by nebulization every 6 (six) hours as needed for wheezing or shortness of breath. 75 mL 2  . amLODipine (NORVASC) 10 MG tablet Take 1 tablet (10 mg total) by mouth daily. 30 tablet 2  . anastrozole (ARIMIDEX) 1 MG tablet Take 1 tablet by mouth daily.  10  . budesonide-formoterol (SYMBICORT) 160-4.5 MCG/ACT inhaler Inhale 2 puffs into the lungs every 12 (twelve) hours. 1 Inhaler 0  . metoprolol succinate (TOPROL-XL) 50 MG 24 hr tablet Take 1 tablet (50 mg total) by mouth daily. Take with or immediately following a meal. 30 tablet 5  . montelukast (SINGULAIR) 10 MG tablet Take 1 tablet (10 mg total) by mouth at bedtime. 30 tablet 11  . pantoprazole (PROTONIX) 40 MG tablet Take 1 tablet (40 mg total) by mouth daily. 30 tablet 1  . sertraline (ZOLOFT) 50 MG tablet  Take 1 tablet (50 mg total) by mouth daily. Take 1/2 tab daily for first 2 weeks. 30 tablet 3  . telmisartan (MICARDIS) 40 MG tablet Take two tablets (80mg ) by mouth daily 60 tablet 2   No current facility-administered medications for this visit.    Allergies  Allergen Reactions  . Peanuts [Peanut Oil] Shortness Of Breath     Review of Systems: All systems reviewed and negative except where noted in HPI.     Physical Exam:    Wt Readings from Last 3 Encounters:  06/28/18 295 lb (133.8 kg)  06/21/18 294 lb (133.4 kg)  06/04/18 295 lb 3.2 oz (133.9 kg)    BP (!) 142/86   Pulse 68   Ht 5\' 10"  (1.778 m)   Wt 295 lb (133.8 kg)   BMI 42.33 kg/m  Constitutional:   Pleasant, in no acute distress. Psychiatric: Normal mood and affect. Behavior is normal. EENT: Pupils normal.  Conjunctivae are normal. No scleral icterus. Neck supple. No cervical LAD. Cardiovascular: Normal rate, regular rhythm. No edema Pulmonary/chest: Effort normal and breath sounds normal. No wheezing, rales or rhonchi. Abdominal: Soft, nondistended, nontender. Bowel sounds active throughout. There are no masses palpable. No hepatomegaly. Neurological: Alert and oriented to person place and time. Skin: Skin is warm and dry. No rashes noted.   ASSESSMENT AND PLAN;   Charles Collier is a 37 y.o. male presenting with:  1) GERD: Long-standing history of reflux symptoms, characterized by regurgitation and more recently pyrosis/dyspepsia.  Protonix has helped with the dyspepsia/pyrosis, but no change in regurgitation.  Discussed GERD at length and will plan on evaluation and treatment as below: - Change Protonix to 40 mg taken 30-60 minutes before lunch.  Was taking at 5 AM but not eating until lunchtime. - EGD to evaluate for objective evidence of reflux, to include erosive esophagitis, Barrett's esophagus, as well as evaluation for hiatal hernia and LES laxity - If no objective e/o GERD on EGD, will refer for pH/MII to be done OFF PPI therapy  - Depending on above evaluation, discussed possible surgical intervention. Given BMI 42, would not currently be a candidate for TIF (BMI needs to be <35).   2) Obesity: Weight loss, especially his central adiposity, could certainly be beneficial from a reflux standpoint as well as overall health benefits, and therefore recommend losing 10% body weight over weeks to months through diet/exercise.   The indications, risks, and benefits of EGD were explained to the patient in detail. Risks include but are not limited to bleeding, perforation, adverse reaction to medications, and cardiopulmonary compromise. Sequelae include but are not limited to the  possibility of surgery, hositalization, and mortality. The patient verbalized understanding and wished to proceed. All questions answered, referred to scheduler. Further recommendations pending results of the exam.     Shellia Cleverly, DO, FACG  06/28/2018, 2:40 PM   Wendling, Jilda Roche*

## 2018-06-30 ENCOUNTER — Telehealth: Payer: Self-pay | Admitting: Family Medicine

## 2018-06-30 NOTE — Telephone Encounter (Signed)
Copied from CRM [id number]. Topic: Quick Communication - See Telephone Encounter >> Jun 30, 2018  8:41 AM Jolayne Hainesaylor, Brittany L wrote: CRM for notification. See Telephone encounter for: 06/30/18.  [facility] and needs labs that are pertaining to his Charles Holtsesterone, she needs those as soon as you can. Thanks!  Fax number [phone]

## 2018-06-30 NOTE — Telephone Encounter (Signed)
did call the patient who informed me that he was seen at [facility] before ever seeing PCP. They have his previous labs. This referral was for a followup appt that he missed and was trying to get back in. Called back [facility] and explained situation ...  No problem they do have his records.  The above message is from previous message relating to same request on 06/15/2018

## 2018-07-12 ENCOUNTER — Ambulatory Visit (INDEPENDENT_AMBULATORY_CARE_PROVIDER_SITE_OTHER): Payer: No Typology Code available for payment source | Admitting: Family Medicine

## 2018-07-12 ENCOUNTER — Encounter: Payer: Self-pay | Admitting: Family Medicine

## 2018-07-12 VITALS — BP 108/72 | HR 63 | Temp 98.4°F | Ht 70.0 in | Wt 292.2 lb

## 2018-07-12 DIAGNOSIS — F4010 Social phobia, unspecified: Secondary | ICD-10-CM | POA: Diagnosis not present

## 2018-07-12 DIAGNOSIS — Z23 Encounter for immunization: Secondary | ICD-10-CM

## 2018-07-12 MED ORDER — VENLAFAXINE HCL ER 75 MG PO CP24: 75.0000 mg | ORAL_CAPSULE | Freq: Every day | ORAL | 2 refills | Status: DC

## 2018-07-12 MED FILL — ANASTROZOLE 1 MG TABLET: 1 | 30 days supply | Qty: 60 | Fill #0

## 2018-07-12 MED FILL — CLOMIPHENE CITRATE 50 MG TA: 50 | 60 days supply | Qty: 30 | Fill #0

## 2018-07-12 MED FILL — VENLAFAXINE HCL ER 75 MG CA: 75 | 30 days supply | Qty: 30 | Fill #0

## 2018-07-12 NOTE — Progress Notes (Signed)
Pre visit review using our clinic review tool, if applicable. No additional management support is needed unless otherwise documented below in the visit note. 

## 2018-07-12 NOTE — Progress Notes (Signed)
Chief Complaint  Patient presents with  . Follow-up    Subjective Charles Collier presents for f/u anxiety/depression.  He is currently being treated with Zoloft.  Reports good improvement since treatment. Might be sleepier and is having lower sex drive. No thoughts of harming self or others. No self-medication with alcohol, prescription drugs or illicit drugs.  Had been on Lexapro in college but did not notice a difference. Pt is following with a counselor/psychologist.  ROS Psych: No homicidal or suicidal thoughts  Past Medical History:  Diagnosis Date  . Arthritis   . Asthma   . GERD (gastroesophageal reflux disease)   . Heart murmur   . Hyperlipidemia   . Hypertension   . OSA on CPAP      Exam BP 108/72 (BP Location: Left Arm, Patient Position: Sitting, Cuff Size: Large)   Pulse 63   Temp 98.4 F (36.9 C) (Oral)   Ht 5\' 10"  (1.778 m)   Wt 292 lb 4 oz (132.6 kg)   SpO2 97%   BMI 41.93 kg/m  General:  well developed, well nourished, in no apparent distress Neck: neck supple without adenopathy, thyromegaly, or masses Lungs:  clear to auscultation, breath sounds equal bilaterally, no respiratory distress Cardio:  regular rate and rhythm without murmurs, heart sounds without clicks or rubs Psych: well oriented with normal range of affect and age-appropriate judgement/insight, alert and oriented x4.  Assessment and Plan  Social anxiety disorder - Plan: venlafaxine XR (EFFEXOR-XR) 75 MG 24 hr capsule  Need for influenza vaccination - Plan: Flu Vaccine QUAD 6+ mos PF IM (Fluarix Quad PF)  Stop Zoloft, start SNRI. Counseling desired but not feasible at this time given his line of work.  Counseled on exercise. F/u in 6 mo for CPE. The patient voiced understanding and agreement to the plan.  Jilda Roche [place], DO 07/12/18 10:59 AM

## 2018-07-12 NOTE — Patient Instructions (Signed)
Stay active.  Stop the Zoloft.  Consider counseling if your schedule allows.  Let us know if you need anything.

## 2018-07-13 ENCOUNTER — Ambulatory Visit (AMBULATORY_SURGERY_CENTER): Payer: No Typology Code available for payment source | Admitting: Gastroenterology

## 2018-07-13 ENCOUNTER — Encounter: Payer: Self-pay | Admitting: Gastroenterology

## 2018-07-13 VITALS — BP 107/63 | HR 63 | Temp 99.6°F | Resp 15 | Ht 70.0 in | Wt 295.0 lb

## 2018-07-13 DIAGNOSIS — K219 Gastro-esophageal reflux disease without esophagitis: Secondary | ICD-10-CM

## 2018-07-13 MED ORDER — SODIUM CHLORIDE 0.9 % IV SOLN: 500.0000 mL | Freq: Once | INTRAVENOUS | Status: DC

## 2018-07-13 NOTE — Progress Notes (Signed)
Pt's states no medical or surgical changes since previsit or office visit. 

## 2018-07-13 NOTE — Progress Notes (Signed)
To PACU, VSS. Report to [email or web address] 

## 2018-07-13 NOTE — Progress Notes (Signed)
Called to room to assist during endoscopic procedure.  Patient ID and intended procedure confirmed with present staff. Received instructions for my participation in the procedure from the performing physician.  

## 2018-07-13 NOTE — Op Note (Signed)
[facility] Patient Name: Charles Collier Procedure Date: 07/13/2018 10:01 AM MRN: [id number] Endoscopist: Doristine Locks , MD Age: 37 Referring MD:  Date of Birth: 07-18-1981 Gender: Male Account #: [id number] Procedure:                Upper GI endoscopy Indications:              Heartburn, Suspected esophageal reflux Medicines:                Monitored Anesthesia Care Procedure:                Pre-Anesthesia Assessment:                           - Prior to the procedure, a History and Physical                            was performed, and patient medications and                            allergies were reviewed. The patient's tolerance of                            previous anesthesia was also reviewed. The risks                            and benefits of the procedure and the sedation                            options and risks were discussed with the patient.                            All questions were answered, and informed consent                            was obtained. Prior Anticoagulants: The patient has                            taken no previous anticoagulant or antiplatelet                            agents. ASA Grade Assessment: II - A patient with                            mild systemic disease. After reviewing the risks                            and benefits, the patient was deemed in                            satisfactory condition to undergo the procedure.                           After obtaining informed consent, the endoscope was  passed under direct vision. Throughout the                            procedure, the patient's blood pressure, pulse, and                            oxygen saturations were monitored continuously. The                            Endoscope was introduced through the mouth, and                            advanced to the second part of duodenum. The upper                            GI endoscopy  was accomplished without difficulty.                            The patient tolerated the procedure well. Scope In: Scope Out: Findings:                 Mucosal changes including feline appearance and                            longitudinal furrows were found in the middle third                            of the esophagus and in the lower third of the                            esophagus. Esophageal findings were graded using                            the Eosinophilic Esophagitis Endoscopic Reference                            Score (EoE-EREFS) as: Edema Grade 0 Normal                            (distinct vascular markings), Rings Grade 1 Mild                            (subtle circumferential ridges seen on esophageal                            distension), Furrows Grade 1 Present (vertical                            lines with or without visible depth) and Stricture                            none (no stricture found). Biopsies were obtained  from the proximal and distal esophagus with cold                            forceps for histology of suspected eosinophilic                            esophagitis. Estimated blood loss was minimal.                           Esophagogastric landmarks were identified: the                            Z-line was found at 39 cm, the gastroesophageal                            junction was found at 39 cm and the site of hiatal                            narrowing was found at 39 cm from the incisors.                           The Z-line was regular and was found 39 cm from the                            incisors.                           The gastroesophageal flap valve was visualized                            endoscopically and classified as Hill Grade I                            (prominent fold, tight to endoscope).                           The entire examined stomach was normal.                           The duodenal bulb,  first portion of the duodenum                            and second portion of the duodenum were normal. Complications:            No immediate complications. Estimated Blood Loss:     Estimated blood loss was minimal. Impression:               - Esophageal mucosal changes suggestive of                            eosinophilic esophagitis. Biopsied.                           - Esophagogastric landmarks identified.                           -  Z-line regular, 39 cm from the incisors.                           - Gastroesophageal flap valve classified as Hill                            Grade I (prominent fold, tight to endoscope).                           - Normal stomach.                           - Normal duodenal bulb, first portion of the                            duodenum and second portion of the duodenum. Recommendation:           - Patient has a contact number available for                            emergencies. The signs and symptoms of potential                            delayed complications were discussed with the                            patient. Return to normal activities tomorrow.                            Written discharge instructions were provided to the                            patient.                           - Resume previous diet.                           - Continue present medications.                           - Await pathology results.                           - Perform ambulatory pH monitoring (OFF PPI) at                            appointment to be scheduled. Doristine Locks, MD 07/13/2018 10:27:17 AM

## 2018-07-13 NOTE — Patient Instructions (Signed)
Discharge instructions given. Biopsies taken. Resume previous medications. Perform ambulatory ph monitoring (off PPI) at appointment to be scheduled by office. YOU HAD AN ENDOSCOPIC PROCEDURE TODAY AT THE [facility]:   Refer to the procedure report that was given to you for any specific questions about what was found during the examination.  If the procedure report does not answer your questions, please call your gastroenterologist to clarify.  If you requested that your care partner not be given the details of your procedure findings, then the procedure report has been included in a sealed envelope for you to review at your convenience later.  YOU SHOULD EXPECT: Some feelings of bloating in the abdomen. Passage of more gas than usual.  Walking can help get rid of the air that was put into your GI tract during the procedure and reduce the bloating. If you had a lower endoscopy (such as a colonoscopy or flexible sigmoidoscopy) you may notice spotting of blood in your stool or on the toilet paper. If you underwent a bowel prep for your procedure, you may not have a normal bowel movement for a few days.  Please Note:  You might notice some irritation and congestion in your nose or some drainage.  This is from the oxygen used during your procedure.  There is no need for concern and it should clear up in a day or so.  SYMPTOMS TO REPORT IMMEDIATELY:    Following upper endoscopy (EGD)  Vomiting of blood or coffee ground material  New chest pain or pain under the shoulder blades  Painful or persistently difficult swallowing  New shortness of breath  Fever of 100F or higher  Black, tarry-looking stools  For urgent or emergent issues, a gastroenterologist can be reached at any hour by calling [phone].   DIET:  We do recommend a small meal at first, but then you may proceed to your regular diet.  Drink plenty of fluids but you should avoid alcoholic beverages for 24  hours.  ACTIVITY:  You should plan to take it easy for the rest of today and you should NOT DRIVE or use heavy machinery until tomorrow (because of the sedation medicines used during the test).    FOLLOW UP: Our staff will call the number listed on your records the next business day following your procedure to check on you and address any questions or concerns that you may have regarding the information given to you following your procedure. If we do not reach you, we will leave a message.  However, if you are feeling well and you are not experiencing any problems, there is no need to return our call.  We will assume that you have returned to your regular daily activities without incident.  If any biopsies were taken you will be contacted by phone or by letter within the next 1-3 weeks.  Please call us at [phone] if you have not heard about the biopsies in 3 weeks.    SIGNATURES/CONFIDENTIALITY: You and/or your care partner have signed paperwork which will be entered into your electronic medical record.  These signatures attest to the fact that that the information above on your After Visit Summary has been reviewed and is understood.  Full responsibility of the confidentiality of this discharge information lies with you and/or your care-partner.

## 2018-07-14 ENCOUNTER — Telehealth: Payer: Self-pay

## 2018-07-14 DIAGNOSIS — K219 Gastro-esophageal reflux disease without esophagitis: Secondary | ICD-10-CM

## 2018-07-14 NOTE — Telephone Encounter (Signed)
Patient has been scheduled for pH probe for 08/02/18 8:30 at [facility].  I left a detailed message for him to call back.  Instructions mailed to the patient.

## 2018-07-14 NOTE — Telephone Encounter (Signed)
  Follow up Call-  Call back number 07/13/2018  Post procedure Call Back phone  # [phone]  Permission to leave phone message Yes  Some recent data might be hidden     Patient questions:  Do you have a fever, pain , or abdominal swelling? No. Pain Score  0 *  Have you tolerated food without any problems? Yes.    Have you been able to return to your normal activities? Yes.    Do you have any questions about your discharge instructions: Diet   No. Medications  No. Follow up visit  No.  Do you have questions or concerns about your Care? No.  Actions: * If pain score is 4 or above: No action needed, pain <4.

## 2018-07-15 NOTE — Telephone Encounter (Signed)
Patient notified of the date and time of pH probe.  He will call if he has any questions or concerns once he receives the instructions in the mail

## 2018-07-16 MED FILL — METOPROLOL SUCCINATE ER 50: 50 | 30 days supply | Qty: 30 | Fill #0

## 2018-07-16 MED FILL — AMLODIPINE BESYLATE 10 MG T: 10 | 30 days supply | Qty: 30 | Fill #0

## 2018-07-16 MED FILL — PANTOPRAZOLE SOD DR 40 MG T: 40 | 30 days supply | Qty: 30 | Fill #0

## 2018-07-16 MED FILL — SYMBICORT 160-4.5 MCG INH: 160-4.5 | 30 days supply | Qty: 10 | Fill #0

## 2018-07-20 ENCOUNTER — Encounter: Payer: Self-pay | Admitting: Gastroenterology

## 2018-07-29 ENCOUNTER — Telehealth: Payer: Self-pay | Admitting: Gastroenterology

## 2018-07-29 NOTE — Telephone Encounter (Signed)
Patient needs to resch appt that is sch for Monday at the hosp

## 2018-07-29 NOTE — Telephone Encounter (Signed)
Patient needs to reschedule his ph probe and mano to after the first of the year.  He will call back when he is ready to schedule.

## 2018-08-02 ENCOUNTER — Encounter (HOSPITAL_COMMUNITY): Admission: RE | Payer: Self-pay | Source: Ambulatory Visit

## 2018-08-02 ENCOUNTER — Ambulatory Visit (HOSPITAL_COMMUNITY)
Admission: RE | Admit: 2018-08-02 | Payer: No Typology Code available for payment source | Source: Ambulatory Visit | Admitting: Gastroenterology

## 2018-08-02 SURGERY — MANOMETRY, ESOPHAGUS
Anesthesia: Topical

## 2018-08-06 ENCOUNTER — Other Ambulatory Visit: Payer: Self-pay | Admitting: Pulmonary Disease

## 2018-08-06 DIAGNOSIS — I10 Essential (primary) hypertension: Secondary | ICD-10-CM

## 2018-08-06 MED FILL — VENTOLIN HFA 90 MCG INHALER: 108 (90 BAS | 25 days supply | Qty: 18 | Fill #1

## 2018-08-06 MED FILL — TELMISARTAN 40 MG TABS: 40 | 30 days supply | Qty: 60 | Fill #0

## 2018-08-12 MED FILL — MONTELUKAST SOD 10 MG TAB: 10 | 30 days supply | Qty: 30 | Fill #1

## 2018-08-25 ENCOUNTER — Encounter: Payer: No Typology Code available for payment source | Admitting: Family Medicine

## 2018-09-15 ENCOUNTER — Other Ambulatory Visit: Payer: Self-pay

## 2018-09-15 DIAGNOSIS — I10 Essential (primary) hypertension: Secondary | ICD-10-CM

## 2018-09-20 ENCOUNTER — Other Ambulatory Visit: Payer: Self-pay | Admitting: Pulmonary Disease

## 2018-09-20 ENCOUNTER — Other Ambulatory Visit: Payer: Self-pay | Admitting: Family Medicine

## 2018-09-20 ENCOUNTER — Other Ambulatory Visit: Payer: Self-pay | Admitting: Internal Medicine

## 2018-09-20 ENCOUNTER — Other Ambulatory Visit: Payer: Self-pay

## 2018-09-20 DIAGNOSIS — I10 Essential (primary) hypertension: Secondary | ICD-10-CM

## 2018-09-20 DIAGNOSIS — J4540 Moderate persistent asthma, uncomplicated: Secondary | ICD-10-CM

## 2018-09-20 MED FILL — ANASTROZOLE 1 MG TABLET: 1 | 30 days supply | Qty: 60 | Fill #1

## 2018-09-20 MED FILL — MONTELUKAST SOD 10 MG TAB: 10 | 30 days supply | Qty: 30 | Fill #2

## 2018-09-20 MED FILL — TELMISARTAN 40 MG TABS: 40 | 30 days supply | Qty: 60 | Fill #0

## 2018-09-20 MED FILL — SYMBICORT 160-4.5 MCG INH: 160-4.5 | 30 days supply | Qty: 10 | Fill #0

## 2018-09-22 MED FILL — AMLODIPINE BESYLATE 10 MG T: 10 | 30 days supply | Qty: 30 | Fill #0

## 2018-09-22 NOTE — Telephone Encounter (Deleted)
Arlys JohnBrian,   Did you want pt to get Rx from PCP or can we refill this?

## 2018-09-28 IMAGING — RF DG ESOPHAGUS
6 of 7 series · 14 of 22 positions shown · non-contrast
Comparison: None.

CLINICAL DATA: Epigastric pain, heartburn

EXAM:
ESOPHOGRAM / BARIUM SWALLOW / BARIUM TABLET STUDY
TECHNIQUE: Combined double contrast and single contrast examination performed
using effervescent crystals, thick barium liquid, and thin barium
liquid. The patient was observed with fluoroscopy swallowing a 13 mm
barium sulphate tablet.
FLUOROSCOPY TIME:  Fluoroscopy Time:  1 minutes 30 seconds
Radiation Exposure Index (if provided by the fluoroscopic device):
24.0
Number of Acquired Spot Images: 7

[Series 1: cp_standard · 0.51mm/px · 3 of 84 frames shown (1 of 5)]
[frame 13/84]
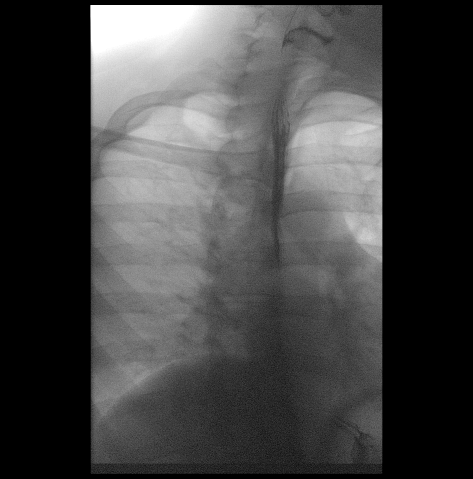
[frame 58/84]
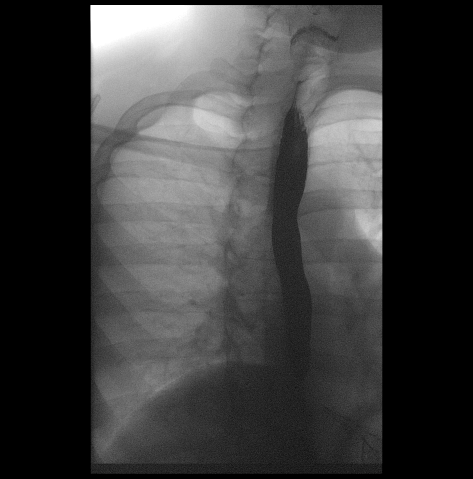
[frame 72/84]
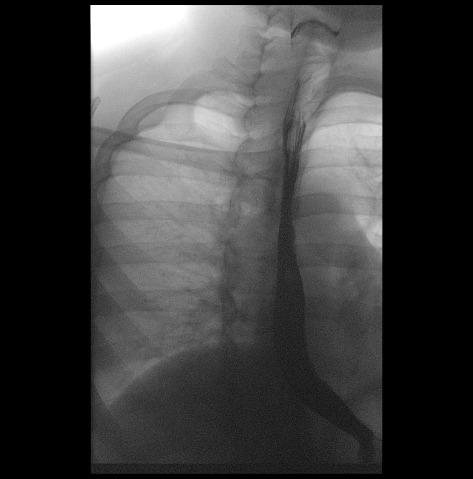

[Series 2: cp_standard · 0.34mm/px · 2 of 49 frames shown (2 of 5)]
[frame 8/49]
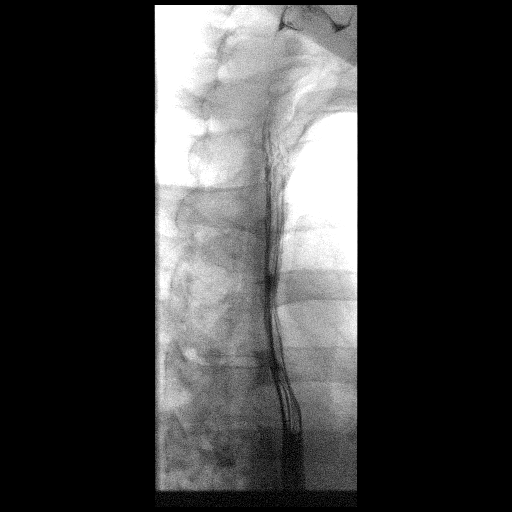
[frame 42/49]
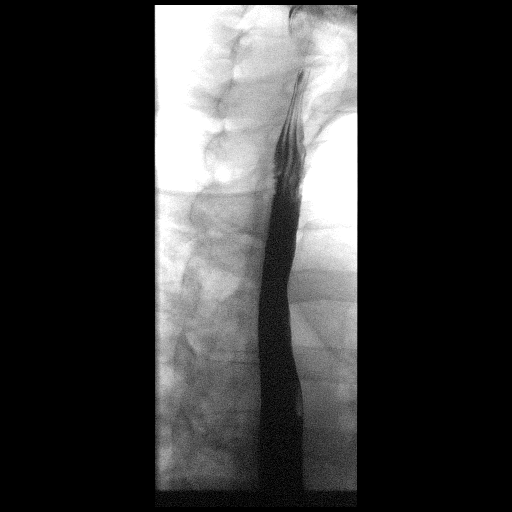

[Series 3: cp_standard · 0.34mm/px · 3 of 58 frames shown (3 of 5)]
[frame 9/58]
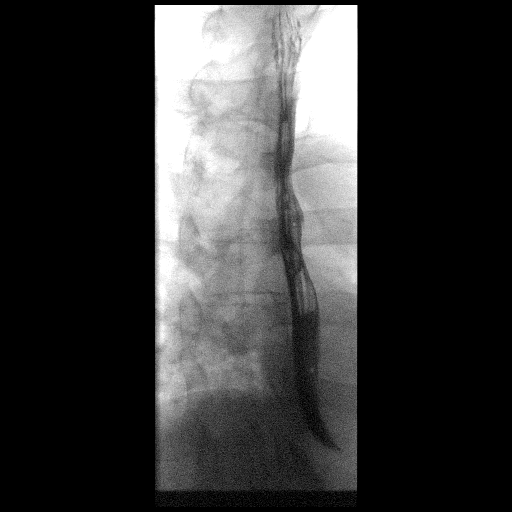
[frame 32/58]
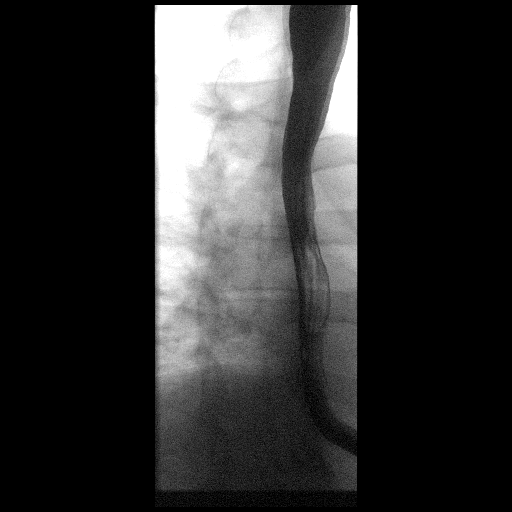
[frame 50/58]
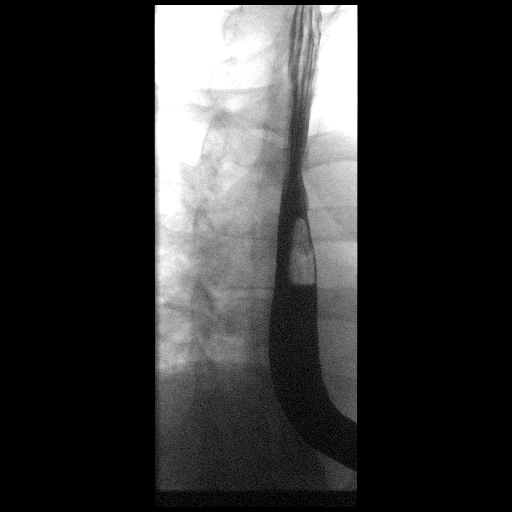

[Series 5: cp_standard · 0.53mm/px · 3 of 51 frames shown (4 of 5)]
[frame 8/51]
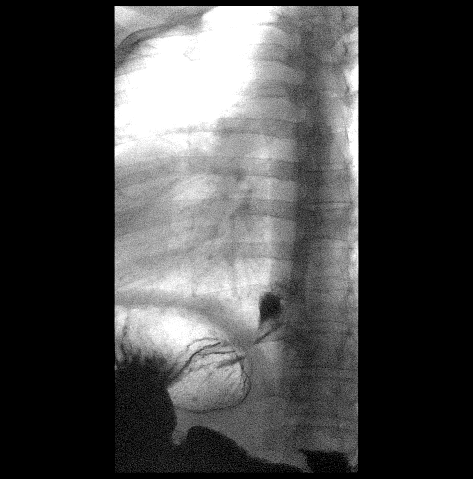
[frame 26/51]
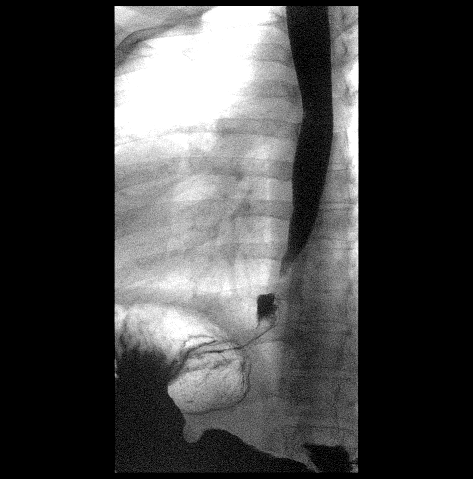
[frame 51/51]
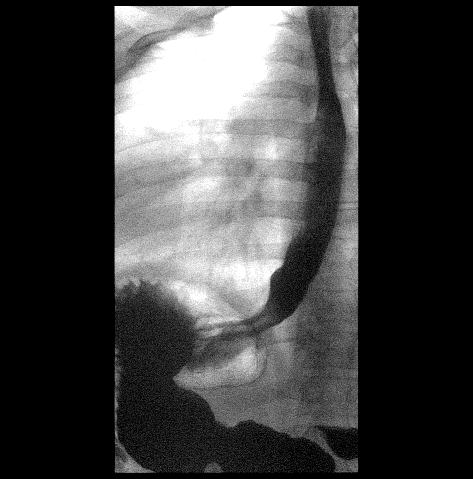

[Series 6: cp_standard · 0.53mm/px · 2 of 50 frames shown (5 of 5)]
[frame 26/50]
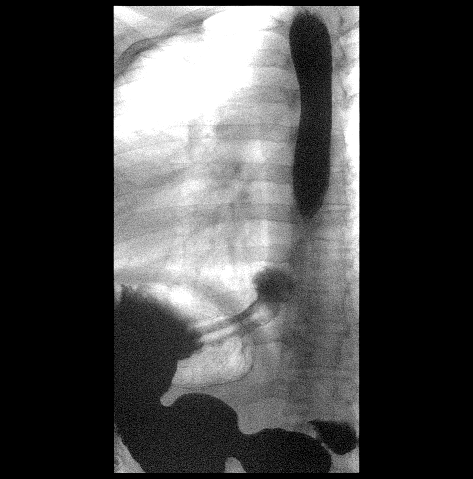
[frame 43/50]
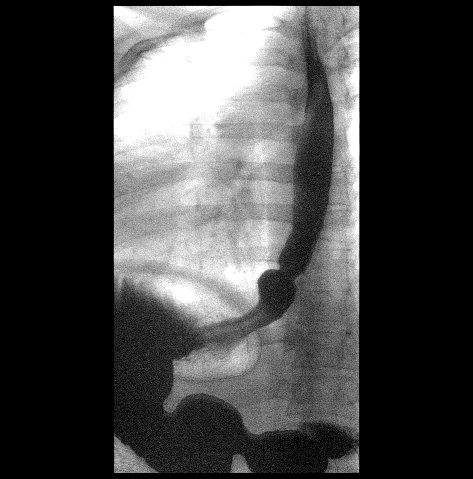

[Series 7: fluoro_barium 2fps_bw · 0.18mm/px · 1 of 1 slices shown]
[im 1/1]
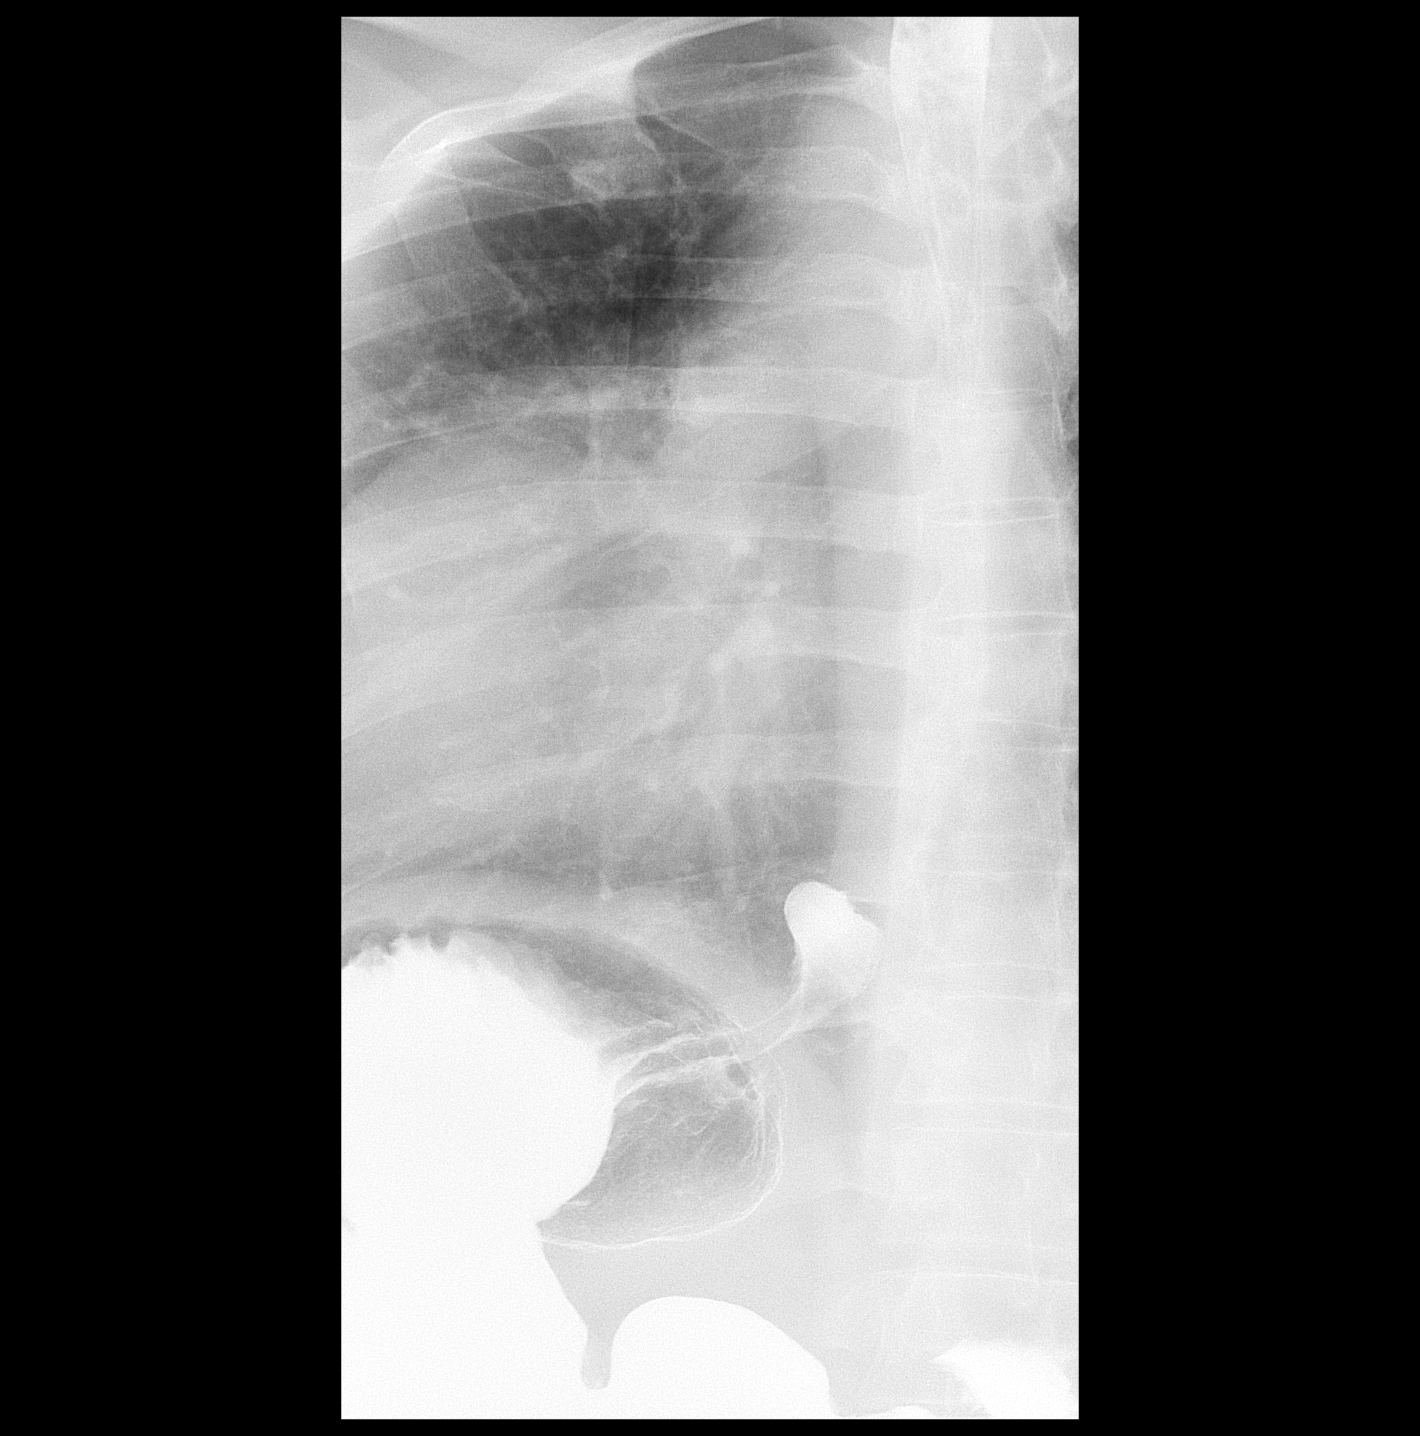

[14 of 22 positions shown; findings below may reference images not displayed]

FINDINGS: Normal esophageal peristalsis.

No fixed esophageal narrowing or stricture. A 13 mm barium tablet
passed into the stomach without delay.

Tiny hiatal hernia in the prone position.

No gastroesophageal reflux was demonstrated.
IMPRESSION: Tiny hiatal hernia.

Otherwise negative double-contrast esophagram.

## 2018-10-20 ENCOUNTER — Encounter: Payer: No Typology Code available for payment source | Admitting: Family Medicine

## 2018-11-01 MED FILL — VENTOLIN HFA 90 MCG INHALER: 108 (90 BAS | 25 days supply | Qty: 18 | Fill #2

## 2018-11-23 ENCOUNTER — Other Ambulatory Visit: Payer: Self-pay | Admitting: Primary Care

## 2018-11-23 ENCOUNTER — Other Ambulatory Visit: Payer: Self-pay | Admitting: Internal Medicine

## 2018-11-23 DIAGNOSIS — I10 Essential (primary) hypertension: Secondary | ICD-10-CM

## 2018-11-23 MED FILL — TELMISARTAN 40 MG TABS: 40 | 30 days supply | Qty: 60 | Fill #1

## 2018-11-23 MED FILL — METOPROLOL SUCCINATE ER 50: 50 | 30 days supply | Qty: 30 | Fill #1

## 2018-11-23 MED FILL — AMLODIPINE BESYLATE 10 MG T: 10 | 30 days supply | Qty: 30 | Fill #0

## 2018-11-23 MED FILL — VENTOLIN HFA 90 MCG INHALER: 108 (90 BAS | 25 days supply | Qty: 18 | Fill #0

## 2018-11-23 MED FILL — MONTELUKAST SOD 10 MG TAB: 10 | 30 days supply | Qty: 30 | Fill #3

## 2018-12-20 ENCOUNTER — Ambulatory Visit: Payer: No Typology Code available for payment source | Admitting: Internal Medicine

## 2018-12-21 ENCOUNTER — Other Ambulatory Visit: Payer: Self-pay | Admitting: Family Medicine

## 2018-12-21 DIAGNOSIS — J4540 Moderate persistent asthma, uncomplicated: Secondary | ICD-10-CM

## 2018-12-22 MED FILL — SYMBICORT 160-4.5 MCG INH: 160-4.5 | 30 days supply | Qty: 10 | Fill #0

## 2018-12-24 ENCOUNTER — Ambulatory Visit: Payer: No Typology Code available for payment source | Admitting: Pulmonary Disease

## 2018-12-24 ENCOUNTER — Encounter: Payer: Self-pay | Admitting: Nurse Practitioner

## 2018-12-24 ENCOUNTER — Other Ambulatory Visit: Payer: Self-pay

## 2018-12-24 ENCOUNTER — Encounter: Payer: Self-pay | Admitting: Family Medicine

## 2018-12-24 ENCOUNTER — Ambulatory Visit (INDEPENDENT_AMBULATORY_CARE_PROVIDER_SITE_OTHER): Payer: No Typology Code available for payment source | Admitting: Family Medicine

## 2018-12-24 ENCOUNTER — Ambulatory Visit: Payer: No Typology Code available for payment source | Admitting: Nurse Practitioner

## 2018-12-24 VITALS — BP 108/72 | HR 45 | Temp 98.1°F | Ht 70.0 in | Wt 285.4 lb

## 2018-12-24 VITALS — BP 126/68 | HR 60 | Ht 70.0 in | Wt 286.0 lb

## 2018-12-24 DIAGNOSIS — R5383 Other fatigue: Secondary | ICD-10-CM

## 2018-12-24 DIAGNOSIS — Z23 Encounter for immunization: Secondary | ICD-10-CM

## 2018-12-24 DIAGNOSIS — F4010 Social phobia, unspecified: Secondary | ICD-10-CM

## 2018-12-24 DIAGNOSIS — J3089 Other allergic rhinitis: Secondary | ICD-10-CM

## 2018-12-24 DIAGNOSIS — M25512 Pain in left shoulder: Secondary | ICD-10-CM

## 2018-12-24 DIAGNOSIS — J4530 Mild persistent asthma, uncomplicated: Secondary | ICD-10-CM | POA: Diagnosis not present

## 2018-12-24 DIAGNOSIS — G8929 Other chronic pain: Secondary | ICD-10-CM

## 2018-12-24 DIAGNOSIS — I10 Essential (primary) hypertension: Secondary | ICD-10-CM | POA: Diagnosis not present

## 2018-12-24 DIAGNOSIS — J4540 Moderate persistent asthma, uncomplicated: Secondary | ICD-10-CM

## 2018-12-24 DIAGNOSIS — J4541 Moderate persistent asthma with (acute) exacerbation: Secondary | ICD-10-CM

## 2018-12-24 LAB — COMPREHENSIVE METABOLIC PANEL
ALBUMIN: 4.5 g/dL (ref 3.5–5.2)
ALT: 22 U/L (ref 0–53)
AST: 24 U/L (ref 0–37)
Alkaline Phosphatase: 72 U/L (ref 39–117)
BUN: 19 mg/dL (ref 6–23)
CO2: 26 mEq/L (ref 19–32)
Calcium: 9.7 mg/dL (ref 8.4–10.5)
Chloride: 104 mEq/L (ref 96–112)
Creatinine, Ser: 1.41 mg/dL (ref 0.40–1.50)
GFR: 68.02 mL/min (ref >60.00)
Glucose, Bld: 79 mg/dL (ref 70–99)
Potassium: 4.2 mEq/L (ref 3.5–5.1)
SODIUM: 139 meq/L (ref 135–145)
Total Bilirubin: 0.5 mg/dL (ref 0.2–1.2)
Total Protein: 7.4 g/dL (ref 6.0–8.3)

## 2018-12-24 LAB — LIPID PANEL
Cholesterol: 187 mg/dL (ref 0–200)
HDL: 43.1 mg/dL (ref >39.00)
LDL Cholesterol: 129 mg/dL — ABNORMAL HIGH (ref 0–99)
NONHDL: 144.33
Total CHOL/HDL Ratio: 4
Triglycerides: 75 mg/dL (ref 0.0–149.0)
VLDL: 15 mg/dL (ref 0.0–40.0)

## 2018-12-24 LAB — NITRIC OXIDE: Nitric Oxide: 47

## 2018-12-24 LAB — CBC
HCT: 46.9 % (ref 39.0–52.0)
Hemoglobin: 15.5 g/dL (ref 13.0–17.0)
MCHC: 33 g/dL (ref 30.0–36.0)
MCV: 83.6 fl (ref 78.0–100.0)
Platelets: 291 10*3/uL (ref 150.0–400.0)
RBC: 5.61 Mil/uL (ref 4.22–5.81)
RDW: 13.9 % (ref 11.5–15.5)
WBC: 7.6 10*3/uL (ref 4.0–10.5)

## 2018-12-24 LAB — VITAMIN B12: VITAMIN B 12: 586 pg/mL (ref 211–911)

## 2018-12-24 MED ORDER — MONTELUKAST SODIUM 10 MG PO TABS: 10.0000 mg | ORAL_TABLET | Freq: Every day | ORAL | 11 refills | Status: DC

## 2018-12-24 MED ORDER — BUDESONIDE-FORMOTEROL FUMARATE 160-4.5 MCG/ACT IN AERO: INHALATION_SPRAY | RESPIRATORY_TRACT | 0 refills | Status: DC

## 2018-12-24 MED ORDER — AMLODIPINE BESYLATE 10 MG PO TABS: 10.0000 mg | ORAL_TABLET | Freq: Every day | ORAL | 11 refills | Status: DC

## 2018-12-24 MED ORDER — ALBUTEROL SULFATE (2.5 MG/3ML) 0.083% IN NEBU: 2.5000 mg | INHALATION_SOLUTION | Freq: Four times a day (QID) | RESPIRATORY_TRACT | 2 refills | Status: DC | PRN

## 2018-12-24 MED ORDER — ALBUTEROL SULFATE HFA 108 (90 BASE) MCG/ACT IN AERS: INHALATION_SPRAY | RESPIRATORY_TRACT | 2 refills | Status: DC

## 2018-12-24 MED ORDER — TELMISARTAN 80 MG PO TABS: 80.0000 mg | ORAL_TABLET | Freq: Every day | ORAL | 11 refills | Status: DC

## 2018-12-24 MED ORDER — METHYLPREDNISOLONE ACETATE 80 MG/ML IJ SUSP
80.0000 mg | Freq: Once | INTRAMUSCULAR | Status: AC
  Administered 2018-12-24: 80 mg via INTRAMUSCULAR

## 2018-12-24 NOTE — Patient Instructions (Addendum)
Give [place] 2-3 business days to get the results of your labs back.   Ice/cold pack over area for 10-15 min twice daily.  Heat (pad or rice pillow in microwave) over affected area, 10-15 minutes twice daily.   Biceps Tendon Disruption (Proximal) Rehab Do exercises exactly as told by your health care provider and adjust them as directed. It is normal to feel mild stretching, pulling, tightness, or discomfort as you do these exercises, but you should stop right away if you feel sudden pain or your pain gets worse. Stretching and range of motion exercises These exercises warm up your muscles and joints and improve the movement and flexibility of your arm and shoulder. These exercises also help to relieve pain and stiffness. Exercise A: Shoulder flexion, standing   1. Stand facing a wall. Put your left / right hand on the wall. 2. Slide your left / right hand up the wall. Stop when you feel a stretch in your shoulder, or when you reach the angle recommended by your health care provider.  Use your other hand to help raise your arm, if needed.  As your hand gets higher, you may need to step closer to the wall.  Avoid shrugging your shoulder while you raise your arm. To do this, keep your shoulder blade tucked down toward your spine. 3. Hold for 30 seconds. 4. Slowly return to the starting position. Use your other arm to help, if needed. Repeat 2 times. Complete this exercise 3 times per week. Exercise B: Pendulum   1. Stand near a wall or a surface that you can hold onto for balance. 2. Bend at the waist and let your left / right arm hang straight down. Use your other arm to support you. 3. Relax your arm and shoulder muscles, and move your hips and your trunk so your left / right arm swings freely. Your arm should swing because of the motion of your body, not because you are using your arm or shoulder muscles. 4. Keep moving so your arm swings in the following directions, as told by your health  care provider:  Side to side.  Forward and backward.  In clockwise and counterclockwise circles. 5. Slowly return to the starting position. Repeat 2 times. Complete this exercise 3 times per week.  Strengthening exercises These exercises build strength and endurance in your arm and shoulder. Endurance is the ability to use your muscles for a long time, even after your muscles get tired. Exercise C: Elbow flexion, neutral  1. Sit on a stable chair without armrests, or stand. 2. Hold a 3-5 lb weight in your left / right hand, or hold an exercise band with both hands. Your palms should face each other at the starting position. 3. Bend your left / right elbow and move your hand up toward your shoulder.  Lead with your thumb, and keep your palm facing the same direction.  Keep your other arm straight down, in the starting position. 4. Slowly return to the starting position. Repeat 2-3 times. Complete this exercise 3 times per week. Exercise D: Forearm supination   1. Sit with your left / right forearm on a table. Your elbow should be below shoulder height. Rest your hand over the edge of the table so your palm faces down. 2. If directed, hold a hammer with your left / right hand. 3. Without moving your elbow, slowly rotate your hand so your palm faces up toward the ceiling.  If you are holding a  hammer, begin by holding the hammer near the head. When this exercise gets easier for you, hold the hammer farther down the handle. 4. Hold for 3 seconds. 5. Slowly return to the starting position. Repeat 2 times. Complete this exercise 3 times per week. Exercise E: Scapular retraction   1. Sit in a stable chair without armrests, or stand. 2. Secure an exercise band to a stable object in front of you so the band is at shoulder height. 3. Hold one end of the exercise band in each hand. 4. Squeeze your shoulder blades together and move your elbows slightly behind you. Do not shrug your  shoulders. 5. Hold for 3 seconds. 6. Slowly return to the starting position. Repeat 2 times. Complete this exercise 3 times per week. Exercise F: Scapular protraction, supine   1. Lie on your back on a firm surface. Hold a 3-5 lb weight in your left / right hand. 2. Raise your left / right arm straight into the air so your hand is directly above your shoulder joint. 3. Push the weight into the air so your shoulder lifts off of the surface that you are lying on. Do not move your head, neck, or back. 4. Hold for 3 seconds. 5. Slowly return to the starting position. Let your muscles relax completely before you repeat this exercise. Repeat 2 times. Complete this exercise 3 times per week. This information is not intended to replace advice given to you by your health care provider. Make sure you discuss any questions you have with your health care provider. Document Released: 09/29/2005 Document Revised: 06/05/2016 Document Reviewed: 09/07/2015 Elsevier Interactive Patient Education  2017 [employer].

## 2018-12-24 NOTE — Progress Notes (Signed)
Chief Complaint  Patient presents with  . Follow-up    Subjective Charles Collier is a 38 y.o. male who presents for hypertension follow up. He does not monitor home blood pressures. He is compliant with medications- amlodipine 10 mg/d, Micardis 80 mg/d Patient has these side effects of medication: none He is adhering to a healthy diet overall. Current exercise: elliptical, lift weights  Has been having intermittent fatigue. Unsure why. He wears CPAP and it states he is getting good sleep. Has cleaned up diet, staying active.   L shoulder pain for 2 mo. Lifting wts caused it. Decent ROM, no bruising or swelling. Has not tried anything at home. No neuro s/s's.    Past Medical History:  Diagnosis Date  . Arthritis   . Asthma   . GERD (gastroesophageal reflux disease)   . Heart murmur   . Hyperlipidemia   . Hypertension   . OSA on CPAP     Review of Systems Cardiovascular: no chest pain Respiratory:  no shortness of breath  Exam BP 108/72 (BP Location: Left Arm, Patient Position: Sitting, Cuff Size: Large)   Pulse (!) 45   Temp 98.1 F (36.7 C) (Oral)   Ht 5\' 10"  (1.778 m)   Wt 285 lb 6 oz (129.4 kg)   SpO2 97%   BMI 40.95 kg/m  General:  well developed, well nourished, in no apparent distress Heart: RRR, no bruits, no LE edema Lungs: clear to auscultation, no accessory muscle use MSK: +TTP over coracoid process. Neg Neer's, Hawkins, empty can, lift off, cross over, equivocal Speed's Neuro: DTR's equal and symmetric in UE's b/l Psych: well oriented with normal range of affect and appropriate judgment/insight  Fatigue, unspecified type - Plan: B12, Comprehensive metabolic panel, CBC  Morbid obesity (HCC) - Plan: Lipid panel, Comprehensive metabolic panel  Social anxiety disorder  Essential hypertension - Plan: amLODipine (NORVASC) 10 MG tablet  Chronic left shoulder pain  Need for tetanus booster - Plan: Tdap vaccine greater than or equal to 7yo IM  Orders as  above. Counseled on diet and exercise. Tdap today. Stretches/exercises for shoulder. Suspect biceps tendinopathy. If no improvement, will set up with PT.  F/u in 7 mo for CPE or prn. The patient voiced understanding and agreement to the plan.  Jilda Roche [place], DO 12/24/18  1:41 PM

## 2018-12-24 NOTE — Progress Notes (Signed)
@Patient  ID: Charles Collier, male    DOB: Oct 07, 1981, 38 y.o.   MRN: [id number]  Chief Complaint  Patient presents with  . Asthma    Referring provider: Sharlene Dory*   HPI 38 year old male, former smoker. Patient is a [employer]. Hx HTN, mild persistent asthma, GERD. Patient of Dr. Sherene Sires followed for asthma.  Tests: FENO 01/26/2018  =   29  - 01/26/2018  After extensive coaching inhaler device  effectiveness =    90% try symbicort 80 2bid  - Allergy profile 01/26/18  >  Eos 0.9/  IgE  112  Ragweed, trees, grass  Feno 04/20/18 - 33 Increase Symbicort 160 started on 04/20/18 Trial of singulair 04/20/18 FENO 05/19/18- 26 Continue symbicort 160, inhaler device  effectiveness =    90%   - PFT's  06/21/2018  FEV1 3/77 (105 % ) ratio 74  p 4 % improvement from saba p symb 160 prior to study with DLCO  134 % corrects to 122  % for alv volume      Lab Results  Component Value Date   NITRICOXIDE 47 12/24/2018   OV 12/24/18 - Follow up Patient presents today for routine follow-up.  He states that overall this is been a stable interval for him.  He said the last few days he has felt like he can have a mild asthma exacerbation coming on so he wanted to get checked before he got bad.  Has been having little shortness of breath and wheezing over the last couple days.  He is compliant with his medications.  He is using Symbicort daily, Singulair, and Proventil as needed.  Is requesting a referral to allergy specialist today due to environmental allergies/seasonal allergies. Denies f/c/s, n/v/d, hemoptysis, PND, leg swelling Denies chest pain or edema   Allergies  Allergen Reactions  . Peanuts [Peanut Oil] Shortness Of Breath    Immunization History  Administered Date(s) Administered  . Influenza,inj,Quad PF,6+ Mos 07/12/2018  . Influenza-Unspecified 08/16/2015, 09/02/2016  . Pneumococcal Polysaccharide-23 05/28/2016    Past Medical History:  Diagnosis Date  . Arthritis   . Asthma    . GERD (gastroesophageal reflux disease)   . Heart murmur   . Hyperlipidemia   . Hypertension   . OSA on CPAP     Tobacco History: Social History   Tobacco Use  Smoking Status Former Smoker  . Packs/day: 0.50  . Years: 16.00  . Pack years: 8.00  . Types: Cigarettes  . Last attempt to quit: 02/10/2017  . Years since quitting: 1.8  Smokeless Tobacco Never Used   Counseling given: Yes   Outpatient Encounter Medications as of 12/24/2018  Medication Sig  . albuterol (PROVENTIL) (2.5 MG/3ML) 0.083% nebulizer solution Take 3 mLs (2.5 mg total) by nebulization every 6 (six) hours as needed for wheezing or shortness of breath.  Marland Kitchen albuterol (VENTOLIN HFA) 108 (90 Base) MCG/ACT inhaler INHALE 2 PUFFS BY MOUTH INTO THE LUNGS EVERY 6 HOURS AS NEEDED  . amLODipine (NORVASC) 10 MG tablet TAKE 1 TABLET BY MOUTH ONCE DAILY  . anastrozole (ARIMIDEX) 1 MG tablet Take 1 tablet by mouth daily.  . budesonide-formoterol (SYMBICORT) 160-4.5 MCG/ACT inhaler INHALE 2 PUFFS BY MOUTH INTO THE LUNGS EVERY 12 HOURS  . metoprolol succinate (TOPROL-XL) 50 MG 24 hr tablet Take 1 tablet (50 mg total) by mouth daily. Take with or immediately following a meal.  . montelukast (SINGULAIR) 10 MG tablet Take 1 tablet (10 mg total) by mouth at bedtime.  Marland Kitchen  telmisartan (MICARDIS) 40 MG tablet TAKE 2 TABLETS BY MOUTH DAILY  . [DISCONTINUED] albuterol (PROVENTIL) (2.5 MG/3ML) 0.083% nebulizer solution Take 3 mLs (2.5 mg total) by nebulization every 6 (six) hours as needed for wheezing or shortness of breath.  . [DISCONTINUED] clomiPHENE (CLOMID) 50 MG tablet   . [DISCONTINUED] montelukast (SINGULAIR) 10 MG tablet Take 1 tablet (10 mg total) by mouth at bedtime.  . [DISCONTINUED] pantoprazole (PROTONIX) 40 MG tablet Take 1 tablet (40 mg total) by mouth daily.  . [DISCONTINUED] SYMBICORT 160-4.5 MCG/ACT inhaler INHALE 2 PUFFS BY MOUTH INTO THE LUNGS EVERY 12 HOURS  . [DISCONTINUED] venlafaxine XR (EFFEXOR-XR) 75 MG 24 hr  capsule Take 1 capsule (75 mg total) by mouth daily with breakfast.  . [DISCONTINUED] VENTOLIN HFA 108 (90 Base) MCG/ACT inhaler INHALE 2 PUFFS BY MOUTH INTO THE LUNGS EVERY 6 HOURS AS NEEDED  . [EXPIRED] methylPREDNISolone acetate (DEPO-MEDROL) injection 80 mg    No facility-administered encounter medications on file as of 12/24/2018.      Review of Systems  Review of Systems  Constitutional: Negative.  Negative for chills and fever.  HENT: Negative.   Respiratory: Positive for cough, shortness of breath and wheezing.   Cardiovascular: Negative.  Negative for chest pain, palpitations and leg swelling.  Gastrointestinal: Negative.   Allergic/Immunologic: Negative.   Neurological: Negative.   Psychiatric/Behavioral: Negative.        Physical Exam  BP 126/68 (BP Location: Right Arm, Patient Position: Sitting, Cuff Size: Large)   Pulse 60   Ht 5\' 10"  (1.778 m)   Wt 286 lb (129.7 kg)   SpO2 98%   BMI 41.04 kg/m   Wt Readings from Last 5 Encounters:  12/24/18 286 lb (129.7 kg)  07/13/18 295 lb (133.8 kg)  07/12/18 292 lb 4 oz (132.6 kg)  06/28/18 295 lb (133.8 kg)  06/21/18 294 lb (133.4 kg)     Physical Exam Vitals signs and nursing note reviewed.  Constitutional:      General: He is not in acute distress.    Appearance: He is well-developed.  Cardiovascular:     Rate and Rhythm: Normal rate and regular rhythm.  Pulmonary:     Effort: Pulmonary effort is normal.     Breath sounds: Wheezing present.  Musculoskeletal:        General: No swelling.  Skin:    General: Skin is warm and dry.  Neurological:     Mental Status: He is alert and oriented to person, place, and time.      Assessment & Plan:   Mild persistent asthma Patient presents today for routine follow-up.  He states that overall this is been a stable interval for him.  He said the last few days he has felt like he can have a mild asthma exacerbation coming on so he wanted to get checked before he  got bad.  Has been having little shortness of breath and wheezing over the last couple days.  He is compliant with his medications.  He is using Symbicort daily, Singulair, and Proventil as needed. Will treat for mild asthma exacerbation.  Patient Instructions  Continue Proventil as needed Continue Symbicort Continue Singulair FENO in office today was 47 Depomedrol 80mg  given in office today  Asthma Plan:  Plan A = Automatic =  Symbicort 80 Take 2 puffs first thing in am and then another 2 puffs about 12 hours later.   Work on inhaler technique:  relax and gently blow all the way out then take  a nice smooth deep breath back in, triggering the inhaler at same time you start breathing in.  Hold for up to 5 seconds if you can. Blow out thru nose. Rinse and gargle with water when done  Plan B = Backup Only use your albuterol as a rescue medication to be used if you can't catch your breath by resting or doing a relaxed purse lip breathing pattern.  - The less you use it, the better it will work when you need it. - Ok to use the inhaler up to 2 puffs  every 4 hours if you must but call for appointment if use goes up over your usual need - Don't leave home without it !!  (think of it like the spare tire for your car)   Plan C = Crisis - only use your albuterol nebulizer if you first try Plan B and it fails to help > ok to use the nebulizer up to every 4 hours but if start needing it regularly call for immediate appointment  Follow up with Dr. Sherene Sires in 3 months or sooner if needed          Ivonne Andrew, NP 12/24/2018

## 2018-12-24 NOTE — Assessment & Plan Note (Signed)
Patient presents today for routine follow-up.  He states that overall this is been a stable interval for him.  He said the last few days he has felt like he can have a mild asthma exacerbation coming on so he wanted to get checked before he got bad.  Has been having little shortness of breath and wheezing over the last couple days.  He is compliant with his medications.  He is using Symbicort daily, Singulair, and Proventil as needed. Will treat for mild asthma exacerbation.  Patient Instructions  Continue Proventil as needed Continue Symbicort Continue Singulair FENO in office today was 47 Depomedrol 80mg  given in office today  Asthma Plan:  Plan A = Automatic =  Symbicort 80 Take 2 puffs first thing in am and then another 2 puffs about 12 hours later.   Work on inhaler technique:  relax and gently blow all the way out then take a nice smooth deep breath back in, triggering the inhaler at same time you start breathing in.  Hold for up to 5 seconds if you can. Blow out thru nose. Rinse and gargle with water when done  Plan B = Backup Only use your albuterol as a rescue medication to be used if you can't catch your breath by resting or doing a relaxed purse lip breathing pattern.  - The less you use it, the better it will work when you need it. - Ok to use the inhaler up to 2 puffs  every 4 hours if you must but call for appointment if use goes up over your usual need - Don't leave home without it !!  (think of it like the spare tire for your car)   Plan C = Crisis - only use your albuterol nebulizer if you first try Plan B and it fails to help > ok to use the nebulizer up to every 4 hours but if start needing it regularly call for immediate appointment  Follow up with Dr. Sherene Sires in 3 months or sooner if needed

## 2018-12-24 NOTE — Patient Instructions (Addendum)
Continue Proventil as needed Continue Symbicort Continue Singulair FENO in office today was 47 Depomedrol 80mg  given in office today  Asthma Plan:  Plan A = Automatic =  Symbicort 80 Take 2 puffs first thing in am and then another 2 puffs about 12 hours later.   Work on inhaler technique:  relax and gently blow all the way out then take a nice smooth deep breath back in, triggering the inhaler at same time you start breathing in.  Hold for up to 5 seconds if you can. Blow out thru nose. Rinse and gargle with water when done  Plan B = Backup Only use your albuterol as a rescue medication to be used if you can't catch your breath by resting or doing a relaxed purse lip breathing pattern.  - The less you use it, the better it will work when you need it. - Ok to use the inhaler up to 2 puffs  every 4 hours if you must but call for appointment if use goes up over your usual need - Don't leave home without it !!  (think of it like the spare tire for your car)   Plan C = Crisis - only use your albuterol nebulizer if you first try Plan B and it fails to help > ok to use the nebulizer up to every 4 hours but if start needing it regularly call for immediate appointment  Follow up with Dr. Sherene Sires in 3 months or sooner if needed

## 2018-12-27 ENCOUNTER — Encounter: Payer: No Typology Code available for payment source | Admitting: Family Medicine

## 2018-12-30 NOTE — Progress Notes (Signed)
Chart and office note reviewed in detail  > agree with a/p as outlined    

## 2019-01-04 ENCOUNTER — Telehealth: Payer: Self-pay | Admitting: Nurse Practitioner

## 2019-01-04 MED ORDER — ALBUTEROL SULFATE (2.5 MG/3ML) 0.083% IN NEBU: 2.5000 mg | INHALATION_SOLUTION | Freq: Four times a day (QID) | RESPIRATORY_TRACT | 3 refills | Status: DC | PRN

## 2019-01-04 MED FILL — VENTOLIN HFA 90 MCG INHALER: 108 (90 BAS | 25 days supply | Qty: 18 | Fill #1

## 2019-01-04 MED FILL — TELMISARTAN 40 MG TABS: 40 | 30 days supply | Qty: 60 | Fill #2

## 2019-01-04 MED FILL — ANASTROZOLE 1 MG TABLET: 1 | 30 days supply | Qty: 60 | Fill #2

## 2019-01-04 MED FILL — METOPROLOL SUCCINATE ER 50: 50 | 30 days supply | Qty: 30 | Fill #2

## 2019-01-04 MED FILL — MONTELUKAST SOD 10 MG TAB: 10 | 30 days supply | Qty: 30 | Fill #4

## 2019-01-04 MED FILL — AMLODIPINE BESYLATE 10 MG T: 10 | 90 days supply | Qty: 90 | Fill #0

## 2019-01-04 NOTE — Telephone Encounter (Signed)
Returned call to patient and advised that [facility] temporarily closed. Pt would like refill sent to [facility]. Refill submitted. Nothing further needed.

## 2019-01-29 ENCOUNTER — Telehealth: Payer: No Typology Code available for payment source | Admitting: Physician Assistant

## 2019-01-29 DIAGNOSIS — M549 Dorsalgia, unspecified: Secondary | ICD-10-CM | POA: Diagnosis not present

## 2019-01-29 MED ORDER — CYCLOBENZAPRINE HCL 10 MG PO TABS: 10.0000 mg | ORAL_TABLET | Freq: Three times a day (TID) | ORAL | 0 refills | Status: DC | PRN

## 2019-01-29 MED ORDER — NAPROXEN 500 MG PO TABS: 500.0000 mg | ORAL_TABLET | Freq: Two times a day (BID) | ORAL | 0 refills | Status: DC

## 2019-01-29 MED FILL — NAPROXEN 500 MG TABLET: 500 | 10 days supply | Qty: 20 | Fill #0

## 2019-01-29 MED FILL — CYCLOBENZAPRINE HCL 10 MG T: 10 | 5 days supply | Qty: 15 | Fill #0

## 2019-01-29 NOTE — Progress Notes (Signed)

## 2019-01-29 NOTE — Progress Notes (Signed)
I have spent 5 minutes in review of e-visit questionnaire, review and updating patient chart, medical decision making and response to patient.   Lauralynn Loeb Cody Moishe Schellenberg, PA-C    

## 2019-01-31 MED FILL — SYMBICORT 160-4.5 MCG INH: 160-4.5 | 30 days supply | Qty: 10 | Fill #0

## 2019-02-11 MED FILL — MONTELUKAST SOD 10 MG TAB: 10 | 30 days supply | Qty: 30 | Fill #5

## 2019-02-11 MED FILL — TELMISARTAN 40 MG TABS: 40 | 30 days supply | Qty: 60 | Fill #3

## 2019-03-11 ENCOUNTER — Ambulatory Visit: Payer: Self-pay | Admitting: Allergy

## 2019-03-15 ENCOUNTER — Other Ambulatory Visit: Payer: Self-pay | Admitting: Nurse Practitioner

## 2019-03-15 DIAGNOSIS — J4540 Moderate persistent asthma, uncomplicated: Secondary | ICD-10-CM

## 2019-03-15 MED FILL — TELMISARTAN 40 MG TABS: 40 | 30 days supply | Qty: 60 | Fill #4

## 2019-03-15 MED FILL — MONTELUKAST SOD 10 MG TAB: 10 | 30 days supply | Qty: 30 | Fill #6

## 2019-03-15 MED FILL — ALBUTEROL SULFATE HFA 108 (: 108 (90 BAS | 25 days supply | Qty: 18 | Fill #2

## 2019-03-15 MED FILL — SYMBICORT 160-4.5 MCG INH: 160-4.5 | 30 days supply | Qty: 10 | Fill #0

## 2019-03-15 MED FILL — ANASTROZOLE 1 MG TABLET: 1 | 30 days supply | Qty: 60 | Fill #3

## 2019-03-31 ENCOUNTER — Ambulatory Visit: Payer: No Typology Code available for payment source | Admitting: Internal Medicine

## 2019-04-08 MED FILL — ALBUTEROL SULFATE HFA 108 (: 108 (90 BAS | 25 days supply | Qty: 18 | Fill #0

## 2019-04-26 MED FILL — METOPROLOL SUCCINATE ER 50: 50 | 30 days supply | Qty: 30 | Fill #3

## 2019-04-26 MED FILL — MONTELUKAST SOD 10 MG TAB: 10 | 30 days supply | Qty: 30 | Fill #0

## 2019-04-26 MED FILL — TELMISARTAN 80 MG TABLET: 80 | 90 days supply | Qty: 90 | Fill #0

## 2019-04-26 MED FILL — AMLODIPINE BESYLATE 10 MG T: 10 | 90 days supply | Qty: 90 | Fill #1

## 2019-05-02 ENCOUNTER — Ambulatory Visit: Payer: No Typology Code available for payment source | Admitting: Internal Medicine

## 2019-05-04 MED FILL — ALBUTEROL SULFATE HFA 108 (: 108 (90 BAS | 25 days supply | Qty: 18 | Fill #1

## 2019-05-30 MED FILL — ALBUTEROL SULFATE HFA 108 (: 108 (90 BAS | 25 days supply | Qty: 18 | Fill #2

## 2019-05-30 MED FILL — ANASTROZOLE 1 MG TABLET: 1 | 30 days supply | Qty: 60 | Fill #4

## 2019-05-30 MED FILL — MONTELUKAST SOD 10 MG TAB: 10 | 30 days supply | Qty: 30 | Fill #1

## 2019-06-15 MED FILL — SYMBICORT 160-4.5 MCG INH: 160-4.5 | 30 days supply | Qty: 10 | Fill #1

## 2019-06-28 ENCOUNTER — Other Ambulatory Visit: Payer: Self-pay | Admitting: Nurse Practitioner

## 2019-06-28 MED FILL — ALBUTEROL SULFATE HFA 108 (: 108 (90 BAS | 25 days supply | Qty: 18 | Fill #0

## 2019-07-15 MED FILL — MONTELUKAST SOD 10 MG TAB: 10 | 30 days supply | Qty: 30 | Fill #2

## 2019-08-03 MED FILL — ALBUTEROL SULFATE HFA 108 (: 108 (90 BAS | 25 days supply | Qty: 18 | Fill #1

## 2019-08-12 MED FILL — TELMISARTAN 80 MG TABLET: 80 | 90 days supply | Qty: 90 | Fill #1

## 2019-08-12 MED FILL — MONTELUKAST SOD 10 MG TAB: 10 | 30 days supply | Qty: 30 | Fill #3

## 2019-08-12 MED FILL — AMLODIPINE BESYLATE 10 MG T: 10 | 90 days supply | Qty: 90 | Fill #2

## 2019-08-15 MED FILL — SYMBICORT 160-4.5 MCG INH: 160-4.5 | 30 days supply | Qty: 10 | Fill #2

## 2019-09-05 ENCOUNTER — Encounter: Payer: Self-pay | Admitting: Family Medicine

## 2019-09-06 ENCOUNTER — Other Ambulatory Visit: Payer: Self-pay | Admitting: Family Medicine

## 2019-09-06 MED ORDER — EPINEPHRINE 0.3 MG/0.3ML IJ SOAJ: 0.3000 mg | INTRAMUSCULAR | 2 refills | Status: DC | PRN

## 2019-09-14 MED FILL — SYMBICORT 160-4.5 MCG INH: 160-4.5 | 30 days supply | Qty: 10 | Fill #2

## 2019-10-03 MED FILL — MONTELUKAST SOD 10 MG TAB: 10 | 30 days supply | Qty: 30 | Fill #4

## 2019-10-12 ENCOUNTER — Other Ambulatory Visit: Payer: Self-pay

## 2019-10-12 DIAGNOSIS — Z20822 Contact with and (suspected) exposure to covid-19: Secondary | ICD-10-CM

## 2019-10-13 LAB — NOVEL CORONAVIRUS, NAA: SARS-CoV-2, NAA: NOT DETECTED

## 2019-10-18 ENCOUNTER — Telehealth: Payer: Self-pay | Admitting: Family Medicine

## 2019-10-18 MED ORDER — ALBUTEROL SULFATE HFA 108 (90 BASE) MCG/ACT IN AERS: 2.0000 | INHALATION_SPRAY | Freq: Four times a day (QID) | RESPIRATORY_TRACT | 1 refills | Status: DC | PRN

## 2019-10-18 MED ORDER — ALBUTEROL SULFATE (2.5 MG/3ML) 0.083% IN NEBU: 2.5000 mg | INHALATION_SOLUTION | Freq: Four times a day (QID) | RESPIRATORY_TRACT | 0 refills | Status: DC | PRN

## 2019-10-18 MED FILL — ALBUTEROL SULFATE HFA 108 (: 108 (90 BAS | 25 days supply | Qty: 18 | Fill #0

## 2019-10-18 NOTE — Addendum Note (Signed)
Addended by: Scharlene Gloss B on: 10/18/2019 11:08 AM   Modules accepted: Orders

## 2019-10-18 NOTE — Telephone Encounter (Signed)
done

## 2019-10-18 NOTE — Telephone Encounter (Signed)
Pt came in office stating is needing refill for albuterol (PROVENTIL) (2.5 MG/3ML) 0.083% nebulizer solution, since pt is in the building wanting to know if he can get rx today ASAP. Please advise.

## 2019-11-11 MED FILL — AMLODIPINE BESYLATE 10 MG T: 10 | 90 days supply | Qty: 90 | Fill #3

## 2019-11-11 MED FILL — TELMISARTAN 80 MG TABLET: 80 | 30 days supply | Qty: 30 | Fill #2

## 2019-11-11 MED FILL — MONTELUKAST SOD 10 MG TAB: 10 | 30 days supply | Qty: 30 | Fill #5

## 2019-11-30 MED FILL — AMLODIPINE BESYLATE 10 MG T: 10 | 90 days supply | Qty: 90 | Fill #3

## 2019-11-30 MED FILL — MONTELUKAST SOD 10 MG TAB: 10 | 30 days supply | Qty: 30 | Fill #5

## 2019-11-30 MED FILL — TELMISARTAN 80 MG TABLET: 80 | 30 days supply | Qty: 30 | Fill #2

## 2020-01-06 ENCOUNTER — Other Ambulatory Visit: Payer: Self-pay | Admitting: Family Medicine

## 2020-01-09 ENCOUNTER — Other Ambulatory Visit: Payer: Self-pay | Admitting: Nurse Practitioner

## 2020-01-09 ENCOUNTER — Other Ambulatory Visit: Payer: Self-pay | Admitting: Family Medicine

## 2020-01-09 DIAGNOSIS — J4530 Mild persistent asthma, uncomplicated: Secondary | ICD-10-CM

## 2020-01-09 MED FILL — TELMISARTAN 80 MG TABLET: 80 | 30 days supply | Qty: 30 | Fill #0

## 2020-01-27 ENCOUNTER — Telehealth: Payer: Self-pay

## 2020-01-27 ENCOUNTER — Telehealth: Payer: Self-pay | Admitting: Family Medicine

## 2020-01-27 DIAGNOSIS — J4530 Mild persistent asthma, uncomplicated: Secondary | ICD-10-CM

## 2020-01-27 MED ORDER — ALBUTEROL SULFATE HFA 108 (90 BASE) MCG/ACT IN AERS: INHALATION_SPRAY | RESPIRATORY_TRACT | 2 refills | Status: DC

## 2020-01-27 MED ORDER — MONTELUKAST SODIUM 10 MG PO TABS: 10.0000 mg | ORAL_TABLET | Freq: Every day | ORAL | 11 refills | Status: DC

## 2020-01-27 NOTE — Telephone Encounter (Signed)
Medication: albuterol (VENTOLIN HFA) 108 (90 Base) MCG/ACT inhaler [[id number]  montelukast (SINGULAIR) 10 MG tablet [[id number]]      Has the patient contacted their pharmacy? No. (If no, request that the patient contact the pharmacy for the refill.) (If yes, when and what did the pharmacy advise?)  Preferred Pharmacy (with phone number or street name): [id number] [place], [place] - [address]  [address] [place] [address]  Phone:  [phone] Fax:  [phone]  DEA #:  [id number]  Agent: Please be advised that RX refills may take up to 3 business days. We ask that you follow-up with your pharmacy.

## 2020-01-27 NOTE — Telephone Encounter (Signed)
Patient called in to see if DR. Wendling could send in a prescription for  albuterol (VENTOLIN HFA) 108 (90 Base) MCG/ACT inhaler [[id number]]    montelukast (SINGULAIR) 10 MG tablet [[id number]]    Please send it to [id number] - [place], [place] - [address]  [address] [place] [address]  Phone:  [phone] Fax:  [phone]  DEA #:  [id number]

## 2020-01-27 NOTE — Telephone Encounter (Signed)
Sent in

## 2020-01-27 NOTE — Telephone Encounter (Signed)
Sent in and the patient has been informed

## 2020-02-13 ENCOUNTER — Encounter: Payer: Self-pay | Admitting: Family Medicine

## 2020-02-13 ENCOUNTER — Ambulatory Visit: Payer: 59 | Admitting: Family Medicine

## 2020-02-13 ENCOUNTER — Other Ambulatory Visit: Payer: Self-pay

## 2020-02-13 VITALS — BP 140/90 | HR 70 | Temp 96.2°F | Ht 70.0 in | Wt 289.0 lb

## 2020-02-13 DIAGNOSIS — J4540 Moderate persistent asthma, uncomplicated: Secondary | ICD-10-CM

## 2020-02-13 DIAGNOSIS — I10 Essential (primary) hypertension: Secondary | ICD-10-CM | POA: Diagnosis not present

## 2020-02-13 DIAGNOSIS — Z0000 Encounter for general adult medical examination without abnormal findings: Secondary | ICD-10-CM | POA: Diagnosis not present

## 2020-02-13 DIAGNOSIS — J4530 Mild persistent asthma, uncomplicated: Secondary | ICD-10-CM

## 2020-02-13 LAB — COMPREHENSIVE METABOLIC PANEL
ALT: 22 U/L (ref 0–53)
AST: 21 U/L (ref 0–37)
Albumin: 4.1 g/dL (ref 3.5–5.2)
Alkaline Phosphatase: 74 U/L (ref 39–117)
BUN: 14 mg/dL (ref 6–23)
CO2: 25 mEq/L (ref 19–32)
Calcium: 9 mg/dL (ref 8.4–10.5)
Chloride: 108 mEq/L (ref 96–112)
Creatinine, Ser: 1.28 mg/dL (ref 0.40–1.50)
GFR: 75.6 mL/min (ref >60.00)
Glucose, Bld: 98 mg/dL (ref 70–99)
Potassium: 4.1 mEq/L (ref 3.5–5.1)
Sodium: 141 mEq/L (ref 135–145)
Total Bilirubin: 0.5 mg/dL (ref 0.2–1.2)
Total Protein: 6.7 g/dL (ref 6.0–8.3)

## 2020-02-13 LAB — LIPID PANEL
Cholesterol: 169 mg/dL (ref 0–200)
HDL: 39.8 mg/dL (ref >39.00)
LDL Cholesterol: 118 mg/dL — ABNORMAL HIGH (ref 0–99)
NonHDL: 129.47
Total CHOL/HDL Ratio: 4
Triglycerides: 57 mg/dL (ref 0.0–149.0)
VLDL: 11.4 mg/dL (ref 0.0–40.0)

## 2020-02-13 LAB — CBC
HCT: 44.8 % (ref 39.0–52.0)
Hemoglobin: 14.6 g/dL (ref 13.0–17.0)
MCHC: 32.6 g/dL (ref 30.0–36.0)
MCV: 85.5 fl (ref 78.0–100.0)
Platelets: 246 10*3/uL (ref 150.0–400.0)
RBC: 5.24 Mil/uL (ref 4.22–5.81)
RDW: 14.6 % (ref 11.5–15.5)
WBC: 5.7 10*3/uL (ref 4.0–10.5)

## 2020-02-13 MED ORDER — AMLODIPINE BESYLATE 10 MG PO TABS: 10.0000 mg | ORAL_TABLET | Freq: Every day | ORAL | 2 refills | Status: DC

## 2020-02-13 MED ORDER — FLUTICASONE PROPIONATE 50 MCG/ACT NA SUSP: 2.0000 | Freq: Every day | NASAL | 6 refills | Status: DC

## 2020-02-13 MED ORDER — METOPROLOL SUCCINATE ER 50 MG PO TB24: 50.0000 mg | ORAL_TABLET | Freq: Every day | ORAL | 2 refills | Status: DC

## 2020-02-13 MED ORDER — LEVOCETIRIZINE DIHYDROCHLORIDE 5 MG PO TABS: 5.0000 mg | ORAL_TABLET | Freq: Every evening | ORAL | 2 refills | Status: DC

## 2020-02-13 MED ORDER — BUDESONIDE-FORMOTEROL FUMARATE 160-4.5 MCG/ACT IN AERO: INHALATION_SPRAY | RESPIRATORY_TRACT | 3 refills | Status: DC

## 2020-02-13 MED ORDER — TELMISARTAN 80 MG PO TABS: 80.0000 mg | ORAL_TABLET | Freq: Every day | ORAL | 2 refills | Status: DC

## 2020-02-13 MED ORDER — MONTELUKAST SODIUM 10 MG PO TABS: 10.0000 mg | ORAL_TABLET | Freq: Every day | ORAL | 2 refills | Status: DC

## 2020-02-13 MED ORDER — EPINEPHRINE 0.3 MG/0.3ML IJ SOAJ: 0.3000 mg | INTRAMUSCULAR | 2 refills | Status: AC | PRN

## 2020-02-13 NOTE — Progress Notes (Signed)
Chief Complaint  Patient presents with  . Annual Exam    Well Male Charles Collier is here for a complete physical.   His last physical was >1 year ago.  Current diet: in general, a "terrible" diet.   Current exercise: none Weight trend: increasing Daytime fatigue? No. Seat belt? Yes.    Health maintenance Tetanus- Yes HIV- Yes  Past Medical History:  Diagnosis Date  . Arthritis   . Asthma   . GERD (gastroesophageal reflux disease)   . Heart murmur   . Hyperlipidemia   . Hypertension   . OSA on CPAP      Past Surgical History:  Procedure Laterality Date  . ANTERIOR CRUCIATE LIGAMENT REPAIR      Medications  Current Outpatient Medications on File Prior to Visit  Medication Sig Dispense Refill  . albuterol (PROVENTIL) (2.5 MG/3ML) 0.083% nebulizer solution Take 3 mLs (2.5 mg total) by nebulization every 6 (six) hours as needed for wheezing or shortness of breath. 75 mL 0  . albuterol (VENTOLIN HFA) 108 (90 Base) MCG/ACT inhaler INHALE 2 PUFFS EVERY 6 HRS AS NEEDED FOR WHEEZE OR SHORTNESS OF BREATH 8.5 g 2  . anastrozole (ARIMIDEX) 1 MG tablet Take 1 tablet by mouth daily.  10   Allergies Allergies  Allergen Reactions  . Peanuts [Peanut Oil] Shortness Of Breath    Family History Family History  Problem Relation Age of Onset  . Hypertension Mother   . Hypertension Sister   . Hypertension Maternal Grandmother   . Hyperlipidemia Maternal Grandmother   . Cancer Maternal Grandfather        prostate  . Hearing loss Maternal Grandfather   . Hypertension Maternal Grandfather   . Hypertension Paternal Grandmother   . Hypertension Paternal Grandfather   . Colon cancer Neg Hx     Review of Systems: Constitutional: no fevers or chills Eye:  no recent significant change in vision Ear/Nose/Mouth/Throat:  Ears:  no hearing loss Nose/Mouth/Throat:  +nasal congestion, no sore throat Cardiovascular:  no chest pain Respiratory:  no current shortness of  breath Gastrointestinal:  no abdominal pain, no change in bowel habits GU:  Male: negative for dysuria, frequency, and incontinence Musculoskeletal/Extremities:  no pain of the joints Integumentary (Skin/Breast):  no abnormal skin lesions reported Neurologic:  no headaches Endocrine: No unexpected weight changes Hematologic/Lymphatic:  no night sweats  Exam BP 140/90 (BP Location: Left Arm, Patient Position: Sitting, Cuff Size: Large)   Pulse 70   Temp (!) 96.2 F (35.7 C) (Temporal)   Ht 5\' 10"  (1.778 m)   Wt 289 lb (131.1 kg)   SpO2 96%   BMI 41.47 kg/m  General:  well developed, well nourished, in no apparent distress Skin:  no significant moles, warts, or growths Head:  no masses, lesions, or tenderness Eyes:  pupils equal and round, sclera anicteric without injection Ears:  canals without lesions, TMs shiny without retraction, no obvious effusion, no erythema Nose:  nares patent, septum midline, mucosa normal Throat/Pharynx:  lips and gingiva without lesion; tongue and uvula midline; non-inflamed pharynx; no exudates or postnasal drainage Neck: neck supple without adenopathy, thyromegaly, or masses Lungs:  clear to auscultation, breath sounds equal bilaterally, no respiratory distress Cardio:  regular rate and rhythm, no bruits, no LE edema Abdomen:  abdomen soft, nontender; bowel sounds normal; no masses or organomegaly Rectal: Deferred Musculoskeletal:  symmetrical muscle groups noted without atrophy or deformity Extremities:  no clubbing, cyanosis, or edema, no deformities, no skin discoloration Neuro:  gait  normal; deep tendon reflexes normal and symmetric Psych: well oriented with normal range of affect and appropriate judgment/insight  Assessment and Plan  Well adult exam - Plan: CBC, Comprehensive metabolic panel, Lipid panel  Essential hypertension - Plan: amLODipine (NORVASC) 10 MG tablet, metoprolol succinate (TOPROL-XL) 50 MG 24 hr tablet  Moderate persistent  asthma without complication - Plan: budesonide-formoterol (SYMBICORT) 160-4.5 MCG/ACT inhaler  Mild persistent asthma without complication - Plan: montelukast (SINGULAIR) 10 MG tablet   Well 39 y.o. male. Counseled on diet and exercise. Self testicular exams recommended at least monthly.  Other orders as above. Follow up in 6 mo pending the above workup. The patient voiced understanding and agreement to the plan.  [place], DO 02/13/20 8:13 AM

## 2020-02-13 NOTE — Patient Instructions (Addendum)
Give [place] 2-3 business days to get the results of your labs back.   Keep the diet clean and stay active.  Claritin (loratadine), Allegra (fexofenadine), Zyrtec (cetirizine) which is also equivalent to Xyzal (levocetirizine); these are listed in order from weakest to strongest. Generic, and therefore cheaper, options are in the parentheses.   Flonase (fluticasone); nasal spray that is over the counter. 2 sprays each nostril, once daily. Aim towards the same side eye when you spray.  There are available OTC, and the generic versions, which may be cheaper, are in parentheses. Show this to a pharmacist if you have trouble finding any of these items.  Do monthly self testicular checks in the shower. You are feeling for lumps/bumps that don't belong. If you feel anything like this, let me know!  Let us know if you need anything.  Healthy Eating Plan Many factors influence your heart health, including eating and exercise habits. Heart (coronary) risk increases with abnormal blood fat (lipid) levels. Heart-healthy meal planning includes limiting unhealthy fats, increasing healthy fats, and making other small dietary changes. This includes maintaining a healthy body weight to help keep lipid levels within a normal range.  WHAT IS MY PLAN?  Your health care provider recommends that you:  Drink a glass of water before meals to help with satiety.  Eat slowly.  An alternative to the water is to add Metamucil. This will help with satiety as well. It does contain calories, unlike water.  WHAT TYPES OF FAT SHOULD I CHOOSE?  Choose healthy fats more often. Choose monounsaturated and polyunsaturated fats, such as olive oil and canola oil, flaxseeds, walnuts, almonds, and seeds.  Eat more omega-3 fats. Good choices include salmon, mackerel, sardines, tuna, flaxseed oil, and ground flaxseeds. Aim to eat fish at least two times each week.  Avoid foods with partially hydrogenated oils in them. These contain  trans fats. Examples of foods that contain trans fats are stick margarine, some tub margarines, cookies, crackers, and other baked goods. If you are going to avoid a fat, this is the one to avoid!  WHAT GENERAL GUIDELINES DO I NEED TO FOLLOW?  Check food labels carefully to identify foods with trans fats. Avoid these types of options when possible.  Fill one half of your plate with vegetables and green salads. Eat 4-5 servings of vegetables per day. A serving of vegetables equals 1 cup of raw leafy vegetables,  cup of raw or cooked cut-up vegetables, or  cup of vegetable juice.  Fill one fourth of your plate with whole grains. Look for the word "whole" as the first word in the ingredient list.  Fill one fourth of your plate with lean protein foods.  Eat 4-5 servings of fruit per day. A serving of fruit equals one medium whole fruit,  cup of dried fruit,  cup of fresh, frozen, or canned fruit. Try to avoid fruits in cups/syrups as the sugar content can be high.  Eat more foods that contain soluble fiber. Examples of foods that contain this type of fiber are apples, broccoli, carrots, beans, peas, and barley. Aim to get 20-30 g of fiber per day.  Eat more home-cooked food and less restaurant, buffet, and fast food.  Limit or avoid alcohol.  Limit foods that are high in starch and sugar.  Avoid fried foods when able.  Cook foods by using methods other than frying. Baking, boiling, grilling, and broiling are all great options. Other fat-reducing suggestions include: ? Removing the skin from poultry. ?  Removing all visible fats from meats. ? Skimming the fat off of stews, soups, and gravies before serving them. ? Steaming vegetables in water or broth.  Lose weight if you are overweight. Losing just 5-10% of your initial body weight can help your overall health and prevent diseases such as diabetes and heart disease.  Increase your consumption of nuts, legumes, and seeds to 4-5 servings  per week. One serving of dried beans or legumes equals  cup after being cooked, one serving of nuts equals 1 ounces, and one serving of seeds equals  ounce or 1 tablespoon.  WHAT ARE GOOD FOODS CAN I EAT? Grains Grainy breads (try to find bread that is 3 g of fiber per slice or greater), oatmeal, light popcorn. Whole-grain cereals. Rice and pasta, including brown rice and those that are made with whole wheat. Edamame pasta is a great alternative to grain pasta. It has a higher protein content. Try to avoid significant consumption of white bread, sugary cereals, or pastries/baked goods.  Vegetables All vegetables. Cooked white potatoes do not count as vegetables.  Fruits All fruits, but limit pineapple and bananas as these fruits have a higher sugar content.  Meats and Other Protein Sources Lean, well-trimmed beef, veal, pork, and lamb. Chicken and [place] without skin. All fish and shellfish. Wild duck, rabbit, pheasant, and venison. Egg whites or low-cholesterol egg substitutes. Dried beans, peas, lentils, and tofu.Seeds and most nuts.  Dairy Low-fat or nonfat cheeses, including ricotta, string, and mozzarella. Skim or 1% milk that is liquid, powdered, or evaporated. Buttermilk that is made with low-fat milk. Nonfat or low-fat yogurt. Soy/Almond milk are good alternatives if you cannot handle dairy.  Beverages Water is the best for you. Sports drinks with less sugar are more desirable unless you are a highly active athlete.  Sweets and Desserts Sherbets and fruit ices. Honey, jam, marmalade, jelly, and syrups. Dark chocolate.  Eat all sweets and desserts in moderation.  Fats and Oils Nonhydrogenated (trans-free) margarines. Vegetable oils, including soybean, sesame, sunflower, olive, peanut, safflower, corn, canola, and cottonseed. Salad dressings or mayonnaise that are made with a vegetable oil. Limit added fats and oils that you use for cooking, baking, salads, and as  spreads.  Other Cocoa powder. Coffee and tea. Most condiments.  The items listed above may not be a complete list of recommended foods or beverages. Contact your dietitian for more options.

## 2020-03-01 ENCOUNTER — Telehealth: Payer: Self-pay | Admitting: Family Medicine

## 2020-03-01 MED ORDER — ALBUTEROL SULFATE HFA 108 (90 BASE) MCG/ACT IN AERS: INHALATION_SPRAY | RESPIRATORY_TRACT | 2 refills | Status: DC

## 2020-03-01 MED ORDER — ALBUTEROL SULFATE (2.5 MG/3ML) 0.083% IN NEBU: 2.5000 mg | INHALATION_SOLUTION | Freq: Four times a day (QID) | RESPIRATORY_TRACT | 0 refills | Status: DC | PRN

## 2020-03-01 NOTE — Telephone Encounter (Signed)
Medication sent.

## 2020-03-01 NOTE — Telephone Encounter (Signed)
Medication: albuterol (VENTOLIN HFA) 108 (90 Base) MCG/ACT inhaler    Has the patient contacted their pharmacy? No. (If no, request that the patient contact the pharmacy for the refill.) (If yes, when and what did the pharmacy advise?)  Preferred Pharmacy (with phone number or street name): [id number] [place], [place] - [address]  [address] [place] [address]  Phone:  [phone] Fax:  [phone]  DEA #:  [id number]  Agent: Please be advised that RX refills may take up to 3 business days. We ask that you follow-up with your pharmacy.   Patient lost last inhaler

## 2020-03-06 ENCOUNTER — Other Ambulatory Visit: Payer: Self-pay | Admitting: Family Medicine

## 2020-06-26 ENCOUNTER — Telehealth (INDEPENDENT_AMBULATORY_CARE_PROVIDER_SITE_OTHER): Payer: BC Managed Care – PPO | Admitting: Family

## 2020-06-26 ENCOUNTER — Other Ambulatory Visit: Payer: Self-pay

## 2020-06-26 ENCOUNTER — Encounter: Payer: Self-pay | Admitting: Family

## 2020-06-26 DIAGNOSIS — B349 Viral infection, unspecified: Secondary | ICD-10-CM | POA: Diagnosis not present

## 2020-06-26 NOTE — Progress Notes (Signed)
Virtual Visit via Video Note  I connected with Charles Collier on 06/26/20 at  4:40 PM EDT by a video enabled telemedicine application and verified that I am speaking with the correct person using two identifiers.  Location: Patient: car Provider: office   I discussed the limitations of evaluation and management by telemedicine and the availability of in person appointments. The patient expressed understanding and agreed to proceed. Only the patient and myself were present for today's video call.   History of Present Illness:  Patient is a 39 yr old male who presents today to discuss recent symptoms.  He reports that last week he had sore throat, body aches, ear pressure and some neck soreness.  He reports that he was tested for Covid on 9/9 and tested negative. He has been vaccinated with the Laural Benes and [employer] vaccine.   He reports that he is overall feeling much better with resolution of above symptoms. He only remaining symptoms are a very mild sore throat that is continuing to improve and some mild (improved) left ear pressure.   Observations/Objective:   Gen: Awake, alert, no acute distress Resp: Breathing is even and non-labored Psych: calm/pleasant demeanor Neuro: Alert and Oriented x 3, + facial symmetry, speech is clear.   Assessment and Plan:  Viral illness- clinically resolving. covid test was negative.  Pt is advised to call if he develops new symptoms or if he does not continue to improve.   10 minutes spent on today's visit.  Follow Up Instructions:    I discussed the assessment and treatment plan with the patient. The patient was provided an opportunity to ask questions and all were answered. The patient agreed with the plan and demonstrated an understanding of the instructions.   The patient was advised to call back or seek an in-person evaluation if the symptoms worsen or if the condition fails to improve as anticipated.  Lemont Fillers, NP

## 2020-08-13 ENCOUNTER — Other Ambulatory Visit: Payer: Self-pay

## 2020-08-13 ENCOUNTER — Encounter: Payer: Self-pay | Admitting: Family Medicine

## 2020-08-13 ENCOUNTER — Ambulatory Visit: Payer: BC Managed Care – PPO | Admitting: Family Medicine

## 2020-08-13 VITALS — BP 130/86 | HR 66 | Temp 98.3°F | Ht 70.0 in | Wt 298.2 lb

## 2020-08-13 DIAGNOSIS — I10 Essential (primary) hypertension: Secondary | ICD-10-CM | POA: Diagnosis not present

## 2020-08-13 DIAGNOSIS — J302 Other seasonal allergic rhinitis: Secondary | ICD-10-CM | POA: Diagnosis not present

## 2020-08-13 DIAGNOSIS — Z23 Encounter for immunization: Secondary | ICD-10-CM

## 2020-08-13 DIAGNOSIS — J4540 Moderate persistent asthma, uncomplicated: Secondary | ICD-10-CM | POA: Diagnosis not present

## 2020-08-13 MED ORDER — LEVOCETIRIZINE DIHYDROCHLORIDE 5 MG PO TABS: ORAL_TABLET | ORAL | 2 refills | Status: DC

## 2020-08-13 MED ORDER — TADALAFIL 20 MG PO TABS: 10.0000 mg | ORAL_TABLET | ORAL | 2 refills | Status: DC | PRN

## 2020-08-13 MED ORDER — BUDESONIDE-FORMOTEROL FUMARATE 160-4.5 MCG/ACT IN AERO: INHALATION_SPRAY | RESPIRATORY_TRACT | 11 refills | Status: DC

## 2020-08-13 MED ORDER — FLUTICASONE PROPIONATE 50 MCG/ACT NA SUSP: 2.0000 | Freq: Every day | NASAL | 6 refills | Status: DC

## 2020-08-13 NOTE — Patient Instructions (Addendum)
Keep the diet clean and stay active.  Aim to do some physical exertion for 150 minutes per week. This is typically divided into 5 days per week, 30 minutes per day. The activity should be enough to get your heart rate up. Anything is better than nothing if you have time constraints.  Let us know if you need anything. 

## 2020-08-13 NOTE — Progress Notes (Signed)
Chief Complaint  Patient presents with  . Follow-up  . Sinusitis    Subjective Charles Collier is a 39 y.o. male who presents for hypertension follow up. He does not monitor home blood pressures. He is compliant with medications- Norvasc 5 mg/d, telmisartan 80 mg/d, Toprol XL 50 mg/d. Patient has these side effects of medication: none He is not adhering to a healthy diet overall. Current exercise: none recently   Hx of asthma Taking Symbicort 160-4.5 mcg 2 puffs bid and Singulair 10 mg/d. Reports good control when he does take his meds, had run out due to ins change. No recent hosp or steroid use. No coughing, wheezing or sob.   Sinus pain over past 3 mo that comes and goes. Has not been using Flonase or Xyzal. No fevers. Feels decent today. Has not tried anything else. No sick contacts.   Past Medical History:  Diagnosis Date  . Arthritis   . Asthma   . GERD (gastroesophageal reflux disease)   . Heart murmur   . Hyperlipidemia   . Hypertension   . OSA on CPAP     Exam BP 130/86 (BP Location: Left Arm, Patient Position: Sitting, Cuff Size: Normal)   Pulse 66   Temp 98.3 F (36.8 C) (Oral)   Ht 5\' 10"  (1.778 m)   Wt 298 lb 4 oz (135.3 kg)   SpO2 97%   BMI 42.79 kg/m  General:  well developed, well nourished, in no apparent distress HEENT: Ears neg, nares patent w/o dc. No pharyngeal exudate. No ttp over frontal/max sinuses b/l.  Heart: RRR, no bruits, no LE edema Lungs: clear to auscultation, no accessory muscle use Psych: well oriented with normal range of affect and appropriate judgment/insight  Moderate persistent asthma without complication - Plan: budesonide-formoterol (SYMBICORT) 160-4.5 MCG/ACT inhaler  Essential hypertension  Morbid obesity (HCC)  Seasonal allergies  Need for influenza vaccination - Plan: Flu Vaccine QUAD 6+ mos PF IM (Fluarix Quad PF)  1. Controlled. Cont Symbicort 160-4.5 mcg 2 puffs bid and Singulair 10 mg/d 2. Controlled. Cont  Norvasc 5 mg/d, telmisartan 80 mg/d, Toprol XL 50 mg/d. Counseled on diet and exercise. 3. Restart INCS and Xyzal.  F/u in in 6 mo or prn.  The patient voiced understanding and agreement to the plan.  [place], DO 08/13/20  8:22 AM

## 2020-08-17 ENCOUNTER — Other Ambulatory Visit: Payer: Self-pay | Admitting: Family Medicine

## 2021-01-31 ENCOUNTER — Other Ambulatory Visit: Payer: Self-pay | Admitting: Family Medicine

## 2021-02-11 ENCOUNTER — Encounter: Payer: BC Managed Care – PPO | Admitting: Family Medicine

## 2021-03-01 ENCOUNTER — Other Ambulatory Visit: Payer: Self-pay

## 2021-03-01 ENCOUNTER — Encounter: Payer: Self-pay | Admitting: Family Medicine

## 2021-03-01 ENCOUNTER — Ambulatory Visit (INDEPENDENT_AMBULATORY_CARE_PROVIDER_SITE_OTHER): Payer: 59 | Admitting: Family Medicine

## 2021-03-01 VITALS — BP 132/82 | HR 85 | Resp 17 | Ht 70.0 in | Wt 307.0 lb

## 2021-03-01 DIAGNOSIS — J4530 Mild persistent asthma, uncomplicated: Secondary | ICD-10-CM

## 2021-03-01 DIAGNOSIS — Z0000 Encounter for general adult medical examination without abnormal findings: Secondary | ICD-10-CM

## 2021-03-01 DIAGNOSIS — I10 Essential (primary) hypertension: Secondary | ICD-10-CM

## 2021-03-01 DIAGNOSIS — Z1159 Encounter for screening for other viral diseases: Secondary | ICD-10-CM | POA: Diagnosis not present

## 2021-03-01 LAB — CBC
HCT: 44.8 % (ref 39.0–52.0)
Hemoglobin: 14.9 g/dL (ref 13.0–17.0)
MCHC: 33.2 g/dL (ref 30.0–36.0)
MCV: 85.2 fl (ref 78.0–100.0)
Platelets: 289 10*3/uL (ref 150.0–400.0)
RBC: 5.26 Mil/uL (ref 4.22–5.81)
RDW: 14.8 % (ref 11.5–15.5)
WBC: 6.9 10*3/uL (ref 4.0–10.5)

## 2021-03-01 LAB — COMPREHENSIVE METABOLIC PANEL
ALT: 61 U/L — ABNORMAL HIGH (ref 0–53)
AST: 41 U/L — ABNORMAL HIGH (ref 0–37)
Albumin: 4.2 g/dL (ref 3.5–5.2)
Alkaline Phosphatase: 73 U/L (ref 39–117)
BUN: 13 mg/dL (ref 6–23)
CO2: 25 mEq/L (ref 19–32)
Calcium: 9.4 mg/dL (ref 8.4–10.5)
Chloride: 107 mEq/L (ref 96–112)
Creatinine, Ser: 1.33 mg/dL (ref 0.40–1.50)
GFR: 66.95 mL/min (ref >60.00)
Glucose, Bld: 80 mg/dL (ref 70–99)
Potassium: 4 mEq/L (ref 3.5–5.1)
Sodium: 141 mEq/L (ref 135–145)
Total Bilirubin: 0.6 mg/dL (ref 0.2–1.2)
Total Protein: 6.9 g/dL (ref 6.0–8.3)

## 2021-03-01 LAB — LIPID PANEL
Cholesterol: 177 mg/dL (ref 0–200)
HDL: 46.7 mg/dL (ref >39.00)
LDL Cholesterol: 118 mg/dL — ABNORMAL HIGH (ref 0–99)
NonHDL: 130.2
Total CHOL/HDL Ratio: 4
Triglycerides: 63 mg/dL (ref 0.0–149.0)
VLDL: 12.6 mg/dL (ref 0.0–40.0)

## 2021-03-01 MED ORDER — MONTELUKAST SODIUM 10 MG PO TABS: 10.0000 mg | ORAL_TABLET | Freq: Every day | ORAL | 2 refills | Status: DC

## 2021-03-01 MED ORDER — METOPROLOL SUCCINATE ER 50 MG PO TB24: 50.0000 mg | ORAL_TABLET | Freq: Every day | ORAL | 2 refills | Status: DC

## 2021-03-01 MED ORDER — TADALAFIL 20 MG PO TABS
10.0000 mg | ORAL_TABLET | ORAL | 2 refills | Status: DC | PRN
Start: 2021-03-01 — End: 2023-12-14

## 2021-03-01 MED ORDER — TELMISARTAN 80 MG PO TABS
80.0000 mg | ORAL_TABLET | Freq: Every day | ORAL | 2 refills | Status: DC
Start: 2021-03-01 — End: 2021-10-21

## 2021-03-01 MED ORDER — AMLODIPINE BESYLATE 10 MG PO TABS: 10.0000 mg | ORAL_TABLET | Freq: Every day | ORAL | 2 refills | Status: DC

## 2021-03-01 MED ORDER — FLUTICASONE PROPIONATE 50 MCG/ACT NA SUSP: 2.0000 | Freq: Every day | NASAL | 6 refills | Status: DC

## 2021-03-01 NOTE — Progress Notes (Signed)
Chief Complaint  Patient presents with  . Annual Exam    Well Male Charles Collier is here for a complete physical.   His last physical was >1 year ago.  Current diet: in general, diet could be better.   Current exercise: walking Weight trend: has increased Fatigue out of ordinary? Not out of ordinary. Seat belt? Yes.    Health maintenance Tetanus- Yes HIV- Yes Hep C- No  Past Medical History:  Diagnosis Date  . Arthritis   . Asthma   . GERD (gastroesophageal reflux disease)   . Heart murmur   . Hyperlipidemia   . Hypertension   . OSA on CPAP      Past Surgical History:  Procedure Laterality Date  . ANTERIOR CRUCIATE LIGAMENT REPAIR      Medications  Current Outpatient Medications on File Prior to Visit  Medication Sig Dispense Refill  . albuterol (PROVENTIL) (2.5 MG/3ML) 0.083% nebulizer solution Take 3 mLs (2.5 mg total) by nebulization every 6 (six) hours as needed for wheezing or shortness of breath. 75 mL 0  . albuterol (VENTOLIN HFA) 108 (90 Base) MCG/ACT inhaler TAKE 2 PUFFS BY MOUTH EVERY 6 HOURS AS NEEDED FOR WHEEZE OR SHORTNESS OF BREATH 18 each 2  . amLODipine (NORVASC) 10 MG tablet Take 1 tablet (10 mg total) by mouth daily. 90 tablet 2  . anastrozole (ARIMIDEX) 1 MG tablet Take 1 tablet by mouth daily.  10  . budesonide-formoterol (SYMBICORT) 160-4.5 MCG/ACT inhaler INHALE 2 PUFFS BY MOUTH INTO THE LUNGS EVERY 12 HOURS 10.2 g 11  . EPINEPHrine 0.3 mg/0.3 mL IJ SOAJ injection Inject 0.3 mLs (0.3 mg total) into the muscle as needed for anaphylaxis. 1 each 2  . fluticasone (FLONASE) 50 MCG/ACT nasal spray Place 2 sprays into both nostrils daily. 16 g 6  . levocetirizine (XYZAL) 5 MG tablet TAKE 1 TABLET BY MOUTH EVERY DAY IN THE EVENING 30 tablet 2  . metoprolol succinate (TOPROL-XL) 50 MG 24 hr tablet Take 1 tablet (50 mg total) by mouth daily. Take with or immediately following a meal. 90 tablet 2  . montelukast (SINGULAIR) 10 MG tablet Take 1 tablet (10 mg  total) by mouth at bedtime. 90 tablet 2  . tadalafil (CIALIS) 20 MG tablet Take 0.5-1 tablets (10-20 mg total) by mouth every other day as needed for erectile dysfunction. 10 tablet 2  . telmisartan (MICARDIS) 80 MG tablet Take 1 tablet (80 mg total) by mouth daily. 90 tablet 2    Allergies Allergies  Allergen Reactions  . Peanuts [Peanut Oil] Shortness Of Breath    Family History Family History  Problem Relation Age of Onset  . Hypertension Mother   . Hypertension Sister   . Hypertension Maternal Grandmother   . Hyperlipidemia Maternal Grandmother   . Cancer Maternal Grandfather        prostate  . Hearing loss Maternal Grandfather   . Hypertension Maternal Grandfather   . Hypertension Paternal Grandmother   . Hypertension Paternal Grandfather   . Colon cancer Neg Hx     Review of Systems: Constitutional: no fevers or chills Eye:  no recent significant change in vision Ear/Nose/Mouth/Throat:  Ears:  no hearing loss Nose/Mouth/Throat:  no complaints of nasal congestion, no sore throat Cardiovascular:  no chest pain Respiratory:  no shortness of breath Gastrointestinal:  no abdominal pain, no change in bowel habits GU:  Male: negative for dysuria, frequency, and incontinence Musculoskeletal/Extremities:  no pain of the joints Integumentary (Skin/Breast):  no abnormal skin  lesions reported Neurologic:  no headaches Endocrine: No unexpected weight changes Hematologic/Lymphatic:  no night sweats  Exam BP 132/82 (BP Location: Left Arm, Patient Position: Sitting, Cuff Size: Large)   Pulse 85   Resp 17   Ht 5\' 10"  (1.778 m)   Wt (!) 307 lb (139.3 kg)   SpO2 98%   BMI 44.05 kg/m  General:  well developed, well nourished, in no apparent distress Skin:  no significant moles, warts, or growths Head:  no masses, lesions, or tenderness Eyes:  pupils equal and round, sclera anicteric without injection Ears:  canals without lesions, TMs shiny without retraction, no obvious  effusion, no erythema Nose:  nares patent, septum midline, mucosa normal Throat/Pharynx:  lips and gingiva without lesion; tongue and uvula midline; non-inflamed pharynx; no exudates or postnasal drainage Neck: neck supple without adenopathy, thyromegaly, or masses Lungs:  clear to auscultation, breath sounds equal bilaterally, no respiratory distress Cardio:  regular rate and rhythm, no bruits, no LE edema Abdomen:  abdomen soft, nontender; bowel sounds normal; no masses or organomegaly Rectal: Deferred Musculoskeletal:  symmetrical muscle groups noted without atrophy or deformity Extremities:  no clubbing, cyanosis, or edema, no deformities, no skin discoloration Neuro:  gait normal; deep tendon reflexes normal and symmetric Psych: well oriented with normal range of affect and appropriate judgment/insight  Assessment and Plan  Well adult exam - Plan: Comprehensive metabolic panel, Lipid panel  Essential hypertension - Plan: amLODipine (NORVASC) 10 MG tablet, metoprolol succinate (TOPROL-XL) 50 MG 24 hr tablet, CBC  Mild persistent asthma without complication - Plan: montelukast (SINGULAIR) 10 MG tablet  Encounter for hepatitis C screening test for low risk patient - Plan: Hepatitis C antibody  Morbid obesity (HCC), Chronic - Plan: Amb Ref to Medical Weight Management   Well 39 y.o. male. Counseled on diet and exercise. Counseled on risks and benefits of prostate cancer screening with PSA. The patient agrees to forego screening.  Refer [facility].  Other orders as above. Follow up in 6 mo pending the above workup. The patient voiced understanding and agreement to the plan.  [address] [place], DO 03/01/21 1:20 PM

## 2021-03-01 NOTE — Patient Instructions (Addendum)
Give [place] 2-3 business days to get the results of your labs back.   Keep the diet clean and stay active.  Use GoodRx for the tadalafil.  If you do not hear anything about your referral in the next 1-2 weeks, call our office and ask for an update.  Let us know if you need anything.

## 2021-03-04 ENCOUNTER — Other Ambulatory Visit: Payer: Self-pay | Admitting: Family Medicine

## 2021-03-04 DIAGNOSIS — R7989 Other specified abnormal findings of blood chemistry: Secondary | ICD-10-CM

## 2021-03-04 LAB — HEPATITIS C ANTIBODY
Hepatitis C Ab: NONREACTIVE
SIGNAL TO CUT-OFF: 0.26 (ref <1.00)

## 2021-03-24 ENCOUNTER — Other Ambulatory Visit: Payer: Self-pay | Admitting: Family Medicine

## 2021-03-25 ENCOUNTER — Other Ambulatory Visit: Payer: Self-pay | Admitting: Family Medicine

## 2021-04-16 ENCOUNTER — Other Ambulatory Visit: Payer: Self-pay

## 2021-04-16 ENCOUNTER — Other Ambulatory Visit (INDEPENDENT_AMBULATORY_CARE_PROVIDER_SITE_OTHER): Payer: 59

## 2021-04-16 ENCOUNTER — Other Ambulatory Visit: Payer: Self-pay | Admitting: Family Medicine

## 2021-04-16 DIAGNOSIS — R7989 Other specified abnormal findings of blood chemistry: Secondary | ICD-10-CM

## 2021-04-16 LAB — HEPATIC FUNCTION PANEL
ALT: 56 U/L — ABNORMAL HIGH (ref 0–53)
AST: 34 U/L (ref 0–37)
Albumin: 3.8 g/dL (ref 3.5–5.2)
Alkaline Phosphatase: 70 U/L (ref 39–117)
Bilirubin, Direct: 0.1 mg/dL (ref 0.0–0.3)
Total Bilirubin: 0.3 mg/dL (ref 0.2–1.2)
Total Protein: 6.5 g/dL (ref 6.0–8.3)

## 2021-04-16 NOTE — Progress Notes (Signed)
RUQ US.  

## 2021-04-26 ENCOUNTER — Other Ambulatory Visit: Payer: Self-pay

## 2021-04-26 ENCOUNTER — Ambulatory Visit (HOSPITAL_BASED_OUTPATIENT_CLINIC_OR_DEPARTMENT_OTHER)
Admission: RE | Admit: 2021-04-26 | Discharge: 2021-04-26 | Disposition: A | Discharge status: Home / Self Care | Payer: 59 | Source: Ambulatory Visit | Attending: Family Medicine | Admitting: Family Medicine

## 2021-04-26 DIAGNOSIS — R7989 Other specified abnormal findings of blood chemistry: Secondary | ICD-10-CM | POA: Diagnosis present

## 2021-05-08 ENCOUNTER — Other Ambulatory Visit: Payer: Self-pay | Admitting: Family Medicine

## 2021-05-16 ENCOUNTER — Telehealth: Payer: Self-pay

## 2021-05-16 DIAGNOSIS — J4521 Mild intermittent asthma with (acute) exacerbation: Secondary | ICD-10-CM

## 2021-05-16 DIAGNOSIS — J4530 Mild persistent asthma, uncomplicated: Secondary | ICD-10-CM

## 2021-05-16 DIAGNOSIS — J4540 Moderate persistent asthma, uncomplicated: Secondary | ICD-10-CM

## 2021-05-16 NOTE — Telephone Encounter (Signed)
Pt called in wanted to know if Dr. Carmelia Roller could send a script for his nebulizer.

## 2021-05-17 MED ORDER — ALBUTEROL SULFATE (2.5 MG/3ML) 0.083% IN NEBU: 2.5000 mg | INHALATION_SOLUTION | Freq: Four times a day (QID) | RESPIRATORY_TRACT | 0 refills | Status: DC | PRN

## 2021-05-17 NOTE — Addendum Note (Signed)
Addended by: Scharlene Gloss B on: 05/17/2021 08:16 AM   Modules accepted: Orders

## 2021-05-17 NOTE — Telephone Encounter (Signed)
Called the patient informed prescription for machine is ready to pickup Sent in solution as requested

## 2021-05-17 NOTE — Telephone Encounter (Signed)
OK to do. Ty.   

## 2021-08-08 ENCOUNTER — Telehealth: Payer: Self-pay | Admitting: Family Medicine

## 2021-08-08 MED ORDER — ALBUTEROL SULFATE (2.5 MG/3ML) 0.083% IN NEBU: 2.5000 mg | INHALATION_SOLUTION | Freq: Four times a day (QID) | RESPIRATORY_TRACT | 0 refills | Status: DC | PRN

## 2021-08-08 MED ORDER — ALBUTEROL SULFATE HFA 108 (90 BASE) MCG/ACT IN AERS: INHALATION_SPRAY | RESPIRATORY_TRACT | 0 refills | Status: DC

## 2021-08-08 NOTE — Telephone Encounter (Signed)
Pt states he lost his inhaler and is not eligible for a refill yet, he also states he needs a refill of the nebulizer. Please advise   Medication: albuterol (VENTOLIN HFA) 108 (90 Base) MCG/ACT inhaler  Has the patient contacted their pharmacy? Yes.    Preferred Pharmacy (with phone number or street name):  [id number] - [place], [place] - [address]  [address] [place] [address]  Phone:  [phone]  Fax:  [phone]   Medication: albuterol (PROVENTIL) (2.5 MG/3ML) 0.083% nebulizer solution   Has the patient contacted their pharmacy? No.  Preferred Pharmacy:  same pharmacy as listed above

## 2021-08-08 NOTE — Telephone Encounter (Signed)
Rxs sent

## 2021-09-02 ENCOUNTER — Ambulatory Visit: Payer: 59 | Admitting: Family Medicine

## 2021-10-21 ENCOUNTER — Encounter: Payer: Self-pay | Admitting: Family Medicine

## 2021-10-21 ENCOUNTER — Ambulatory Visit: Payer: 59 | Admitting: Family Medicine

## 2021-10-21 ENCOUNTER — Ambulatory Visit: Payer: 59 | Attending: Internal Medicine

## 2021-10-21 ENCOUNTER — Other Ambulatory Visit (HOSPITAL_COMMUNITY): Payer: Self-pay

## 2021-10-21 VITALS — BP 122/82 | HR 67 | Temp 98.9°F | Ht 70.0 in | Wt 327.4 lb

## 2021-10-21 DIAGNOSIS — J4540 Moderate persistent asthma, uncomplicated: Secondary | ICD-10-CM | POA: Diagnosis not present

## 2021-10-21 DIAGNOSIS — I10 Essential (primary) hypertension: Secondary | ICD-10-CM | POA: Diagnosis not present

## 2021-10-21 DIAGNOSIS — Z23 Encounter for immunization: Secondary | ICD-10-CM

## 2021-10-21 DIAGNOSIS — K219 Gastro-esophageal reflux disease without esophagitis: Secondary | ICD-10-CM

## 2021-10-21 LAB — COMPREHENSIVE METABOLIC PANEL
ALT: 26 U/L (ref 0–53)
AST: 21 U/L (ref 0–37)
Albumin: 4.1 g/dL (ref 3.5–5.2)
Alkaline Phosphatase: 66 U/L (ref 39–117)
BUN: 10 mg/dL (ref 6–23)
CO2: 24 mEq/L (ref 19–32)
Calcium: 9.1 mg/dL (ref 8.4–10.5)
Chloride: 107 mEq/L (ref 96–112)
Creatinine, Ser: 1.23 mg/dL (ref 0.40–1.50)
GFR: 73.21 mL/min (ref >60.00)
Glucose, Bld: 94 mg/dL (ref 70–99)
Potassium: 4 mEq/L (ref 3.5–5.1)
Sodium: 140 mEq/L (ref 135–145)
Total Bilirubin: 0.5 mg/dL (ref 0.2–1.2)
Total Protein: 6.9 g/dL (ref 6.0–8.3)

## 2021-10-21 MED ORDER — PANTOPRAZOLE SODIUM 40 MG PO TBEC: 40.0000 mg | DELAYED_RELEASE_TABLET | Freq: Every day | ORAL | 1 refills | Status: DC

## 2021-10-21 MED ORDER — ALBUTEROL SULFATE HFA 108 (90 BASE) MCG/ACT IN AERS
2.0000 | INHALATION_SPRAY | Freq: Four times a day (QID) | RESPIRATORY_TRACT | 2 refills | Status: DC | PRN
  Filled 2021-10-21: qty 8.5, 25d supply, fill #0

## 2021-10-21 MED ORDER — ALBUTEROL SULFATE HFA 108 (90 BASE) MCG/ACT IN AERS: 2.0000 | INHALATION_SPRAY | Freq: Four times a day (QID) | RESPIRATORY_TRACT | 2 refills | Status: DC | PRN

## 2021-10-21 NOTE — Progress Notes (Signed)
Chief Complaint  Patient presents with   Follow-up    Stomach problems    Charles Collier is here for epigastric abdominal pain.  Duration: 10 days Describes as a dull pain.  Nighttime awakenings? No Bleeding? No Weight loss? Not unexpected Palliation: none Provocation: none Associated symptoms: nausea and constipation Denies: fever, vomiting, and diarrhea Treatment to date: Dulcolax  Asthma Stable on Symbicort 160-4.5 mcg 2 puff bid, Singulair 10 mg/d. Compliant, no AE's. Rarely uses albuterol.   Hypertension Patient presents for hypertension follow up. He does not monitor home blood pressures. He is not compliant with medications, has not taken in the last 3 mo. Patient has these side effects of medication: none He is now adhering to a healthy diet overall. Exercise: None No Cp or SOB.    Past Medical History:  Diagnosis Date   Arthritis    Asthma    GERD (gastroesophageal reflux disease)    Heart murmur    Hyperlipidemia    Hypertension    OSA on CPAP     BP 122/82    Pulse 67    Temp 98.9 F (37.2 C) (Oral)    Ht 5\' 10"  (1.778 m)    Wt (!) 327 lb 6 oz (148.5 kg)    SpO2 98%    BMI 46.97 kg/m  Gen.: Awake, alert, appears stated age HEENT: Mucous membranes moist without mucosal lesions Heart: Regular rate and rhythm, no edema, no bruits Lungs: Clear auscultation bilaterally, no rales or wheezing, normal effort without accessory muscle use. Abdomen: Bowel sounds are present. Abdomen is soft, nontender, nondistended, no masses or organomegaly. Negative Murphy's, Rovsing's, McBurney's, and Carnett's sign. Psych: Age appropriate judgment and insight. Normal mood and affect.  Moderate persistent asthma without complication  Essential hypertension - Plan: Comprehensive metabolic panel  Morbid obesity (HCC), Chronic  Gastroesophageal reflux disease, unspecified whether esophagitis present  Need for influenza vaccination - Plan: Flu Vaccine QUAD 6+ mos PF IM  (Fluarix Quad PF)  Need for vaccination against Streptococcus pneumoniae - Plan: Pneumococcal conjugate vaccine 20-valent (Prevnar 20)  Chronic, stable.  Continue Symbicort 160-4.5 mcg, 2 puffs twice daily, Singulair 10 mg daily.  Albuterol as needed.  PCV 20 today. Chronic, appears to be stable.  Monitor blood pressure at home.  Okay to stay off of medication currently.  If elevated over the next month, he will restart some of his medication and let know, if normal he will stop checking. Counseled on diet and exercise. Chronic, not controlled.  71-month trial of Protonix 40 mg daily. COVID bivalent vaccination booster recommended. Follow-up in 6 months for a physical or as needed. Pt voiced understanding and agreement to the plan.  3-month [place], DO 10/21/21 10:52 AM

## 2021-10-21 NOTE — Patient Instructions (Addendum)
Monitor blood pressure at home over the next month. Check 2-3 times per week varying the time of day you do check. If <140/90, OK to stop checking and stay off of medication barring a turn for the worse with your eating.  If you do not hear anything about your referral in the next 1-2 weeks, call our office and ask for an update.  Strong work with your weight loss.  Keep the diet clean and stay active.  Aim to do some physical exertion for 150 minutes per week. This is typically divided into 5 days per week, 30 minutes per day. The activity should be enough to get your heart rate up. Anything is better than nothing if you have time constraints.  Let us know if you need anything.

## 2021-10-21 NOTE — Progress Notes (Signed)
° °  Covid-19 Vaccination Clinic  Name:  Quintavius Niebuhr    MRN: [id number] DOB: 1980/11/08  10/21/2021  Mr. Loeza was observed post Covid-19 immunization for 15 minutes without incident. He was provided with Vaccine Information Sheet and instruction to access the V-Safe system.   Mr. Ruhe was instructed to call 911 with any severe reactions post vaccine: Difficulty breathing  Swelling of face and throat  A fast heartbeat  A bad rash all over body  Dizziness and weakness   Immunizations Administered     Name Date Dose VIS Date Route   Pfizer Covid-19 Vaccine Bivalent Booster 10/21/2021 10:48 AM 0.3 mL 06/12/2021 Intramuscular   Manufacturer: [employer], [employer]   Lot: [id number]   NDC: [phone]

## 2021-10-24 ENCOUNTER — Other Ambulatory Visit (HOSPITAL_BASED_OUTPATIENT_CLINIC_OR_DEPARTMENT_OTHER): Payer: Self-pay

## 2021-10-24 MED ORDER — PFIZER COVID-19 VAC BIVALENT 30 MCG/0.3ML IM SUSP
INTRAMUSCULAR | 0 refills | Status: DC
  Filled 2021-10-24: qty 0.3, 1d supply, fill #0

## 2021-11-22 ENCOUNTER — Other Ambulatory Visit: Payer: Self-pay | Admitting: Family Medicine

## 2021-11-22 DIAGNOSIS — J4540 Moderate persistent asthma, uncomplicated: Secondary | ICD-10-CM

## 2022-02-02 ENCOUNTER — Other Ambulatory Visit: Payer: Self-pay | Admitting: Family Medicine

## 2022-02-03 ENCOUNTER — Telehealth: Payer: Self-pay

## 2022-02-03 NOTE — Telephone Encounter (Signed)
Patient informed we sent in this morning. ?He stated he had already picked it up yesterday at the pharmacy. ?So need has been met. ?

## 2022-02-03 NOTE — Telephone Encounter (Signed)
Charles Collier ?Gender: Male ?DOB: 1981/02/01 ?Age: 41 Y 2 M 28 D ?ReturnPhone ?JOINOM:[phone] ?Provider Arva Chafe- MD ?---Caller states that he lost his inhaler ( pro-air) and ?would like a refill. Denies stx. States he takes the ?inhaler as needed ?

## 2022-02-24 ENCOUNTER — Other Ambulatory Visit: Payer: Self-pay | Admitting: Family Medicine

## 2022-03-11 ENCOUNTER — Other Ambulatory Visit: Payer: Self-pay | Admitting: Family Medicine

## 2022-03-11 DIAGNOSIS — J4530 Mild persistent asthma, uncomplicated: Secondary | ICD-10-CM

## 2022-04-21 ENCOUNTER — Encounter: Payer: Self-pay | Admitting: Family Medicine

## 2022-04-21 ENCOUNTER — Other Ambulatory Visit (HOSPITAL_COMMUNITY): Payer: Self-pay

## 2022-04-21 ENCOUNTER — Ambulatory Visit: Payer: 59 | Admitting: Family Medicine

## 2022-04-21 VITALS — BP 138/80 | HR 56 | Temp 98.0°F | Ht 70.0 in | Wt 328.5 lb

## 2022-04-21 DIAGNOSIS — Z0000 Encounter for general adult medical examination without abnormal findings: Secondary | ICD-10-CM | POA: Diagnosis not present

## 2022-04-21 LAB — CBC
HCT: 45.7 % (ref 39.0–52.0)
Hemoglobin: 14.8 g/dL (ref 13.0–17.0)
MCHC: 32.4 g/dL (ref 30.0–36.0)
MCV: 85.6 fl (ref 78.0–100.0)
Platelets: 286 10*3/uL (ref 150.0–400.0)
RBC: 5.34 Mil/uL (ref 4.22–5.81)
RDW: 14.9 % (ref 11.5–15.5)
WBC: 7.1 10*3/uL (ref 4.0–10.5)

## 2022-04-21 LAB — COMPREHENSIVE METABOLIC PANEL
ALT: 32 U/L (ref 0–53)
AST: 25 U/L (ref 0–37)
Albumin: 4.2 g/dL (ref 3.5–5.2)
Alkaline Phosphatase: 69 U/L (ref 39–117)
BUN: 11 mg/dL (ref 6–23)
CO2: 27 mEq/L (ref 19–32)
Calcium: 9.1 mg/dL (ref 8.4–10.5)
Chloride: 106 mEq/L (ref 96–112)
Creatinine, Ser: 1.28 mg/dL (ref 0.40–1.50)
GFR: 69.55 mL/min (ref >60.00)
Glucose, Bld: 95 mg/dL (ref 70–99)
Potassium: 4.2 mEq/L (ref 3.5–5.1)
Sodium: 140 mEq/L (ref 135–145)
Total Bilirubin: 0.5 mg/dL (ref 0.2–1.2)
Total Protein: 7 g/dL (ref 6.0–8.3)

## 2022-04-21 LAB — LIPID PANEL
Cholesterol: 161 mg/dL (ref 0–200)
HDL: 41.3 mg/dL (ref >39.00)
LDL Cholesterol: 106 mg/dL — ABNORMAL HIGH (ref 0–99)
NonHDL: 120.05
Total CHOL/HDL Ratio: 4
Triglycerides: 68 mg/dL (ref 0.0–149.0)
VLDL: 13.6 mg/dL (ref 0.0–40.0)

## 2022-04-21 MED ORDER — SEMAGLUTIDE-WEIGHT MANAGEMENT 2.4 MG/0.75ML ~~LOC~~ SOAJ: 2.4000 mg | SUBCUTANEOUS | 0 refills | Status: AC

## 2022-04-21 MED ORDER — FLUTICASONE PROPIONATE HFA 110 MCG/ACT IN AERO: 2.0000 | INHALATION_SPRAY | Freq: Two times a day (BID) | RESPIRATORY_TRACT | 1 refills | Status: DC

## 2022-04-21 MED ORDER — SEMAGLUTIDE-WEIGHT MANAGEMENT 0.5 MG/0.5ML ~~LOC~~ SOAJ: 0.5000 mg | SUBCUTANEOUS | 0 refills | Status: AC

## 2022-04-21 MED ORDER — SEMAGLUTIDE-WEIGHT MANAGEMENT 1.7 MG/0.75ML ~~LOC~~ SOAJ: 1.7000 mg | SUBCUTANEOUS | 0 refills | Status: AC

## 2022-04-21 MED ORDER — ALBUTEROL SULFATE HFA 108 (90 BASE) MCG/ACT IN AERS
2.0000 | INHALATION_SPRAY | Freq: Four times a day (QID) | RESPIRATORY_TRACT | 2 refills | Status: DC | PRN
Start: 2022-04-21 — End: 2022-06-09

## 2022-04-21 MED ORDER — FLUTICASONE PROPIONATE HFA 110 MCG/ACT IN AERO
2.0000 | INHALATION_SPRAY | Freq: Two times a day (BID) | RESPIRATORY_TRACT | 1 refills | Status: DC
  Filled 2022-04-21: qty 12, 30d supply, fill #0

## 2022-04-21 MED ORDER — ALBUTEROL SULFATE HFA 108 (90 BASE) MCG/ACT IN AERS
2.0000 | INHALATION_SPRAY | Freq: Four times a day (QID) | RESPIRATORY_TRACT | 2 refills | Status: DC | PRN
  Filled 2022-04-21: qty 8.5, 25d supply, fill #0

## 2022-04-21 MED ORDER — SEMAGLUTIDE-WEIGHT MANAGEMENT 1 MG/0.5ML ~~LOC~~ SOAJ: 1.0000 mg | SUBCUTANEOUS | 0 refills | Status: AC

## 2022-04-21 MED ORDER — SEMAGLUTIDE-WEIGHT MANAGEMENT 0.25 MG/0.5ML ~~LOC~~ SOAJ: 0.2500 mg | SUBCUTANEOUS | 0 refills | Status: AC

## 2022-04-21 NOTE — Progress Notes (Signed)
Chief Complaint  Patient presents with   Follow-up    6 month     Well Male Charles Collier is here for a complete physical.   His last physical was >1 year ago.  Current diet: in general, a "healthy" diet.   Current exercise: none Weight trend: stable Fatigue out of ordinary? No. Seat belt? Yes.   Advanced directive? No  Health maintenance Tetanus- Yes HIV- Yes Hep C- Yes  Morbid Obesity Struggling with weight.  Diet and exercise as above.  He has never been on the medicine before.  He does not have time to see a weight loss clinic due to his driving schedule.  He is not interested in surgery.  His asthma worsens the more he weighs.  He is hopeful to get off of his CPAP.  Past Medical History:  Diagnosis Date   Arthritis    Asthma    GERD (gastroesophageal reflux disease)    Heart murmur    Hyperlipidemia    Hypertension    OSA on CPAP      Past Surgical History:  Procedure Laterality Date   ANTERIOR CRUCIATE LIGAMENT REPAIR      Medications  Current Outpatient Medications on File Prior to Visit  Medication Sig Dispense Refill   albuterol (PROVENTIL) (2.5 MG/3ML) 0.083% nebulizer solution Take 3 mLs (2.5 mg total) by nebulization every 6 (six) hours as needed for wheezing or shortness of breath. 75 mL 0   anastrozole (ARIMIDEX) 1 MG tablet Take 1 tablet by mouth daily.  10   budesonide-formoterol (SYMBICORT) 160-4.5 MCG/ACT inhaler INHALE 2 PUFFS BY MOUTH INTO THE LUNGS EVERY 12 HOURS 10.2 each 11   COVID-19 mRNA bivalent vaccine, Pfizer, (PFIZER COVID-19 VAC BIVALENT) injection Inject into the muscle. 0.3 mL 0   EPINEPHrine 0.3 mg/0.3 mL IJ SOAJ injection Inject 0.3 mLs (0.3 mg total) into the muscle as needed for anaphylaxis. 1 each 2   fluticasone (FLONASE) 50 MCG/ACT nasal spray Place 2 sprays into both nostrils daily. 16 g 6   levocetirizine (XYZAL) 5 MG tablet TAKE 1 TABLET BY MOUTH EVERY DAY IN THE EVENING 30 tablet 2   montelukast (SINGULAIR) 10 MG tablet  TAKE 1 TABLET BY MOUTH EVERYDAY AT BEDTIME 30 tablet 8   pantoprazole (PROTONIX) 40 MG tablet Take 1 tablet (40 mg total) by mouth daily. 30 tablet 1   tadalafil (CIALIS) 20 MG tablet Take 0.5-1 tablets (10-20 mg total) by mouth every other day as needed for erectile dysfunction. 30 tablet 2   Allergies Allergies  Allergen Reactions   Peanuts [Peanut Oil] Shortness Of Breath    Family History Family History  Problem Relation Age of Onset   Hypertension Mother    Hypertension Sister    Hypertension Maternal Grandmother    Hyperlipidemia Maternal Grandmother    Cancer Maternal Grandfather        prostate   Hearing loss Maternal Grandfather    Hypertension Maternal Grandfather    Hypertension Paternal Grandmother    Hypertension Paternal Grandfather    Colon cancer Neg Hx     Review of Systems: Constitutional: no fevers or chills Eye:  no recent significant change in vision Ear/Nose/Mouth/Throat:  Ears:  no hearing loss Nose/Mouth/Throat:  no complaints of nasal congestion, no sore throat Cardiovascular:  no chest pain Respiratory:  no shortness of breath Gastrointestinal:  no abdominal pain, no change in bowel habits GU:  Male: negative for dysuria, frequency, and incontinence Musculoskeletal/Extremities:  no pain of the joints Integumentary (  Skin/Breast):  no abnormal skin lesions reported Neurologic:  no headaches Endocrine: No unexpected weight changes Hematologic/Lymphatic:  no night sweats  Exam BP 138/80   Pulse (!) 56   Temp 98 F (36.7 C) (Oral)   Ht 5\' 10"  (1.778 m)   Wt (!) 328 lb 8 oz (149 kg)   SpO2 96%   BMI 47.13 kg/m  General:  well developed, well nourished, in no apparent distress Skin:  no significant moles, warts, or growths Head:  no masses, lesions, or tenderness Eyes:  pupils equal and round, sclera anicteric without injection Ears:  canals without lesions, TMs shiny without retraction, no obvious effusion, no erythema Nose:  nares patent,  septum midline, mucosa normal Throat/Pharynx:  lips and gingiva without lesion; tongue and uvula midline; non-inflamed pharynx; no exudates or postnasal drainage Neck: neck supple without adenopathy, thyromegaly, or masses Lungs:  clear to auscultation, breath sounds equal bilaterally, no respiratory distress Cardio:  regular rate and rhythm, no bruits, no LE edema Abdomen:  abdomen soft, nontender; bowel sounds normal; no masses or organomegaly Rectal: Deferred Musculoskeletal:  symmetrical muscle groups noted without atrophy or deformity Extremities:  no clubbing, cyanosis, or edema, no deformities, no skin discoloration Neuro:  gait normal; deep tendon reflexes normal and symmetric Psych: well oriented with normal range of affect and appropriate judgment/insight  Assessment and Plan  Well adult exam - Plan: CBC, Comprehensive metabolic panel, Lipid panel  Morbid obesity (HCC) - Plan: Semaglutide-Weight Management 0.25 MG/0.5ML SOAJ, Semaglutide-Weight Management 0.5 MG/0.5ML SOAJ, Semaglutide-Weight Management 1 MG/0.5ML SOAJ, Semaglutide-Weight Management 1.7 MG/0.75ML SOAJ, Semaglutide-Weight Management 2.4 MG/0.75ML SOAJ   Well 41 y.o. male. Counseled on diet and exercise. Counseled on risks and benefits of prostate cancer screening with PSA. The patient agrees to forego screening.  Morbid obesity: Chronic, not controlled.  Counseled on various modalities to help lose weight including bariatric surgery, nutrition counseling, and our nonsurgical medical weight loss team.  Decided to consider injectable GLP-1.  We will titrate to the highest tolerated dose.  Send me a message if there are issues. Advanced directive form provided today.  Other orders as above. Follow up in 6 mo pending the above workup. The patient voiced understanding and agreement to the plan.  [address] [place], DO 04/21/22 12:12 PM

## 2022-04-21 NOTE — Patient Instructions (Addendum)
Give [place] 2-3 business days to get the results of your labs back.   Keep the diet clean and stay active.  Please get me a copy of your advanced directive form at your convenience.   Asthma Action Plan There are 3 zones to consider: 1. Green Zone- This is the zone we hope to keep you in. Avoiding allergens and compliance with inhalers will keep you in this safe zone. No need to take anything extra while in this zone. 2. Yellow Zone- This zone is where we can save time, money and visits. You are not doing well enough to be considered in the green zone, but not bad enough to be in the red zone. This is where we need to remove any allergen or factor that is contributing to your breathing issues. You have a separate inhaler (inhaled steroid/Flovent) to take twice daily in addition to your other inhalers. This should give you the push you need to get back to the green zone. Contact our office if you have any questions. 3. Red Zone- This is the zone where you should consider calling 911 vs going to the ER. Despite compliance with inhalers/meds (or maybe because we haven't been using them), our breathing isn't where we want it to be. Albuterol isn't helping as much either and you need medical care. This is the zone we try to avoid as much as possible!  Let us know if you need anything.  Stretching and range of motion exercises These exercises warm up your muscles and joints and improve the movement and flexibility of your lower leg. These exercises also help to relieve pain and stiffness.  Exercise A: Gastrocnemius stretch Sit with your left / right leg extended. Loop a belt or towel around the ball of your left / right foot. The ball of your foot is on the walking surface, right under your toes. Hold both ends of the belt or towel. Keep your left / right ankle and foot relaxed and keep your knee straight while you use the belt or towel to pull your foot and ankle toward you. Stop at the first point of  resistance. Hold this position for 30 seconds. Repeat 2 times. Complete this exercise 3 times per week.  Exercise B: Ankle alphabet Sit with your left / right leg supported at the lower leg. Do not rest your foot on anything. Make sure your foot has room to move freely. Think of your left / right foot as a paintbrush, and move your foot to trace each letter of the alphabet in the air. Keep your hip and knee still while you trace. Trace every letter from A to Z. Repeat 2 times. Complete this exercise 3 times per week.  Strengthening exercises These exercises build strength and endurance in your lower leg. Endurance is the ability to use your muscles for a long time, even after they get tired.  Exercise C: Plantar flexors with band Sit with your left / right leg extended. Loop a rubber exercise band or tube around the ball of your left / right foot. The ball of your foot is on the walking surface, right under your toes. While holding both ends of the band or tube, slowly point your toes downward, pushing them away from you. Hold this position for 3 seconds. Slowly return your foot to the starting position and repeat for a total of 10 repetitions. Repeat 2 times. Complete this exercise 3 times per week.  Exercise D: Plantar flexors, standing Stand  with your feet shoulder-width apart. Place your hands on a wall or table to steady yourself as needed, but try not to use it very much for support. Rise up on your toes. If this exercise is too easy, try these options: Shift your weight toward your left / right leg until you feel challenged. If told by your health care provider, stand on your left / right foot only. Hold this position for 3 seconds. Repeat for a total of 10 repetitions. Repeat 2 times. Complete this exercise 3 times per week.  Exercise E: Plantar flexors, eccentric Stand on the balls of your feet on the edge of a step. The ball of your foot is on the walking surface, right  under your toes. Place your hands on a wall or railing for balance as needed, but try not to lean on it for support. Rise up on your toes, using both legs to help. Slowly shift all of your weight to your left / right foot and lift your other foot off the step. Slowly lower your left / right heel so it drops below the level of the step. You will feel a slight stretch in your left / right calf. Put your other foot back onto the step. Repeat 2 times. Complete this exercise 3 times per week. This information is not intended to replace advice given to you by your health care provider. Make sure you discuss any questions you have with your health care provider.

## 2022-05-10 ENCOUNTER — Other Ambulatory Visit: Payer: Self-pay | Admitting: Family Medicine

## 2022-06-08 ENCOUNTER — Other Ambulatory Visit: Payer: Self-pay | Admitting: Family Medicine

## 2022-08-19 ENCOUNTER — Other Ambulatory Visit: Payer: Self-pay | Admitting: Family Medicine

## 2022-10-14 ENCOUNTER — Ambulatory Visit
Admission: RE | Admit: 2022-10-14 | Discharge: 2022-10-14 | Disposition: A | Discharge status: Home / Self Care | Payer: 59 | Source: Ambulatory Visit | Attending: Family Medicine | Admitting: Family Medicine

## 2022-10-14 ENCOUNTER — Other Ambulatory Visit: Payer: Self-pay

## 2022-10-14 ENCOUNTER — Ambulatory Visit (INDEPENDENT_AMBULATORY_CARE_PROVIDER_SITE_OTHER): Payer: 59

## 2022-10-14 VITALS — BP 130/85 | HR 90 | Temp 98.7°F | Resp 17

## 2022-10-14 DIAGNOSIS — Z1152 Encounter for screening for COVID-19: Secondary | ICD-10-CM | POA: Diagnosis not present

## 2022-10-14 DIAGNOSIS — J069 Acute upper respiratory infection, unspecified: Secondary | ICD-10-CM | POA: Diagnosis not present

## 2022-10-14 DIAGNOSIS — J4541 Moderate persistent asthma with (acute) exacerbation: Secondary | ICD-10-CM | POA: Diagnosis not present

## 2022-10-14 MED ORDER — BENZONATATE 100 MG PO CAPS: 100.0000 mg | ORAL_CAPSULE | Freq: Three times a day (TID) | ORAL | 0 refills | Status: DC | PRN

## 2022-10-14 MED ORDER — PREDNISONE 20 MG PO TABS: 40.0000 mg | ORAL_TABLET | Freq: Every day | ORAL | 0 refills | Status: AC

## 2022-10-14 NOTE — Discharge Instructions (Signed)
Your chest x-ray is clear without pneumonia or fluid  Take prednisone 20 mg--2 daily for 5 days  Take benzonatate 100 mg, 1 tab every 8 hours as needed for cough.   You have been swabbed for COVID, and the test will result in the next 24 hours. Our staff will call you if positive. If the COVID test is positive, you should quarantine for 5 days from the start of your symptoms

## 2022-10-14 NOTE — ED Provider Notes (Addendum)
[facility]    CSN: [id number] Arrival date & time: 10/14/22  1335      History   Chief Complaint Chief Complaint  Patient presents with   Cough    HPI Charles Collier is a 42 y.o. male.    Cough  Here for cough and congestion and fever.  Symptoms began on the evening of December 29.  He has not had any fever since December 31.  Temperature went as high as 100.7.  No nausea or vomiting or diarrhea.  He has had some chest congestion and wheezing.  He does have a history of asthma and takes Symbicort twice daily.  Also he is having some sharp right lower chest pain.  It radiates to his back.  Past Medical History:  Diagnosis Date   Arthritis    Asthma    GERD (gastroesophageal reflux disease)    Heart murmur    Hyperlipidemia    Hypertension    OSA on CPAP     Patient Active Problem List   Diagnosis Date Noted   Seasonal allergies 08/13/2020   Chronic left shoulder pain 12/24/2018   Morbid obesity ([place]) 06/22/2018   Asthma exacerbation 05/19/2018   Orthopnea 05/19/2018   Rib pain on right side 05/19/2018   Social anxiety disorder 05/19/2018   Low testosterone level in male 05/19/2018   Gastroesophageal reflux disease 04/20/2018   Mild persistent asthma 01/26/2018   Status asthmaticus 02/21/2017   Moderate persistent asthma 02/21/2017   Moderate persistent asthma with status asthmaticus 02/20/2017   Essential hypertension 03/19/2013    Past Surgical History:  Procedure Laterality Date   ANTERIOR CRUCIATE LIGAMENT REPAIR         Home Medications    Prior to Admission medications   Medication Sig Start Date End Date Taking? Authorizing Provider  benzonatate (TESSALON) 100 MG capsule Take 1 capsule (100 mg total) by mouth 3 (three) times daily as needed for cough. 10/14/22  Yes Barrett Henle, MD  predniSONE (DELTASONE) 20 MG tablet Take 2 tablets (40 mg total) by mouth daily with breakfast for 5 days. 10/14/22 10/19/22 Yes Barrett Henle, MD   albuterol (PROVENTIL) (2.5 MG/3ML) 0.083% nebulizer solution INHALE 3 ML BY NEBULIZATION EVERY 6 HOURS AS NEEDED FOR WHEEZING OR SHORTNESS OF BREATH 05/12/22   Shelda Pal, DO  albuterol (VENTOLIN HFA) 108 (90 Base) MCG/ACT inhaler INHALE 2 PUFFS BY MOUTH EVERY 6 HOURS AS NEEDED 08/19/22   Wendling, Crosby Oyster, DO  anastrozole (ARIMIDEX) 1 MG tablet Take 1 tablet by mouth daily. 01/20/18   [provider]  budesonide-formoterol (SYMBICORT) 160-4.5 MCG/ACT inhaler INHALE 2 PUFFS BY MOUTH INTO THE LUNGS EVERY 12 HOURS 11/22/21   Wendling, Crosby Oyster, DO  COVID-19 mRNA bivalent vaccine, Pfizer, (PFIZER COVID-19 VAC BIVALENT) injection Inject into the muscle. 10/21/21   Carlyle Basques, MD  EPINEPHrine 0.3 mg/0.3 mL IJ SOAJ injection Inject 0.3 mLs (0.3 mg total) into the muscle as needed for anaphylaxis. 02/13/20   Shelda Pal, DO  fluticasone (FLONASE) 50 MCG/ACT nasal spray Place 2 sprays into both nostrils daily. 03/01/21   Shelda Pal, DO  fluticasone (FLOVENT HFA) 110 MCG/ACT inhaler Inhale 2 puffs into the lungs 2 (two) times daily when in your yellow zone. Rinse mouth out after use. 04/21/22   Shelda Pal, DO  levocetirizine (XYZAL) 5 MG tablet TAKE 1 TABLET BY MOUTH EVERY DAY IN THE EVENING 08/13/20   Wendling, Crosby Oyster, DO  montelukast (SINGULAIR) 10 MG  tablet TAKE 1 TABLET BY MOUTH EVERYDAY AT BEDTIME 03/11/22   Wendling, Crosby Oyster, DO  pantoprazole (PROTONIX) 40 MG tablet Take 1 tablet (40 mg total) by mouth daily. 10/21/21   Shelda Pal, DO  tadalafil (CIALIS) 20 MG tablet Take 0.5-1 tablets (10-20 mg total) by mouth every other day as needed for erectile dysfunction. 03/01/21   Shelda Pal, DO    Family History Family History  Problem Relation Age of Onset   Hypertension Mother    Hypertension Sister    Hypertension Maternal Grandmother    Hyperlipidemia Maternal Grandmother    Cancer Maternal  Grandfather        prostate   Hearing loss Maternal Grandfather    Hypertension Maternal Grandfather    Hypertension Paternal Grandmother    Hypertension Paternal Grandfather    Colon cancer Neg Hx     Social History Social History   Tobacco Use   Smoking status: Former    Packs/day: 0.50    Years: 16.00    Total pack years: 8.00    Types: Cigarettes    Quit date: 02/10/2017    Years since quitting: 5.6   Smokeless tobacco: Never  Vaping Use   Vaping Use: Former  Substance Use Topics   Alcohol use: Yes    Comment: occ   Drug use: No     Allergies   Peanuts [peanut oil]   Review of Systems Review of Systems  Respiratory:  Positive for cough.      Physical Exam Triage Vital Signs ED Triage Vitals  Enc Vitals Group     BP 10/14/22 1403 130/85     Pulse Rate 10/14/22 1403 90     Resp 10/14/22 1403 17     Temp 10/14/22 1403 98.7 F (37.1 C)     Temp Source 10/14/22 1403 Oral     SpO2 10/14/22 1403 94 %     Weight --      Height --      Head Circumference --      Peak Flow --      Pain Score 10/14/22 1410 0     Pain Loc --      Pain Edu? --      Excl. in [place]? --    No data found.  Updated Vital Signs BP 130/85 (BP Location: Right Arm)   Pulse 90   Temp 98.7 F (37.1 C) (Oral)   Resp 17   SpO2 94%   Visual Acuity Right Eye Distance:   Left Eye Distance:   Bilateral Distance:    Right Eye Near:   Left Eye Near:    Bilateral Near:     Physical Exam Vitals reviewed.  Constitutional:      General: He is not in acute distress.    Appearance: He is not toxic-appearing.  HENT:     Right Ear: Tympanic membrane and ear canal normal.     Left Ear: Tympanic membrane and ear canal normal.     Nose: Nose normal.     Mouth/Throat:     Mouth: Mucous membranes are moist.     Pharynx: No oropharyngeal exudate or posterior oropharyngeal erythema.  Eyes:     Extraocular Movements: Extraocular movements intact.     Conjunctiva/sclera: Conjunctivae  normal.     Pupils: Pupils are equal, round, and reactive to light.  Cardiovascular:     Rate and Rhythm: Normal rate and regular rhythm.     Heart sounds: No murmur  heard. Pulmonary:     Effort: No respiratory distress.     Breath sounds: No stridor. No wheezing, rhonchi or rales.  Chest:     Chest wall: No tenderness.  Musculoskeletal:     Cervical back: Neck supple.  Lymphadenopathy:     Cervical: No cervical adenopathy.  Skin:    Capillary Refill: Capillary refill takes less than 2 seconds.     Coloration: Skin is not jaundiced or pale.  Neurological:     General: No focal deficit present.     Mental Status: He is alert and oriented to person, place, and time.  Psychiatric:        Behavior: Behavior normal.      UC Treatments / Results  Labs (all labs ordered are listed, but only abnormal results are displayed) Labs Reviewed  SARS CORONAVIRUS 2 (TAT 6-24 HRS)    EKG   Radiology DG Chest 2 View  Result Date: 10/14/2022 CLINICAL DATA:  Cough.  Right lower chest pain. EXAM: CHEST - 2 VIEW COMPARISON:  AP chest 02/20/2017 FINDINGS: Cardiac silhouette and mediastinal contours are within normal limits. The lungs are clear. No pleural effusion or pneumothorax. No acute skeletal abnormality. IMPRESSION: No active cardiopulmonary disease. Electronically Signed   By: Yvonne Kendall M.D.   On: 10/14/2022 14:53    Procedures Procedures (including critical care time)  Medications Ordered in UC Medications - No data to display  Initial Impression / Assessment and Plan / UC Course  I have reviewed the triage vital signs and the nursing notes.  Pertinent labs & imaging results that were available during my care of the patient were reviewed by me and considered in my medical decision making (see chart for details).        Chest x-ray is clear.  Treatment is sent for asthma exacerbation.  COVID swab is done, and if positive he is a candidate for Paxlovid; his last EGFR was  69 in July 2023 Final Clinical Impressions(s) / UC Diagnoses   Final diagnoses:  Viral URI with cough  Moderate persistent asthma with acute exacerbation     Discharge Instructions      Your chest x-ray is clear without pneumonia or fluid  Take prednisone 20 mg--2 daily for 5 days  Take benzonatate 100 mg, 1 tab every 8 hours as needed for cough.   You have been swabbed for COVID, and the test will result in the next 24 hours. Our staff will call you if positive. If the COVID test is positive, you should quarantine for 5 days from the start of your symptoms      ED Prescriptions     Medication Sig Dispense Auth. Provider   benzonatate (TESSALON) 100 MG capsule Take 1 capsule (100 mg total) by mouth 3 (three) times daily as needed for cough. 21 capsule Barrett Henle, MD   predniSONE (DELTASONE) 20 MG tablet Take 2 tablets (40 mg total) by mouth daily with breakfast for 5 days. 10 tablet Windy Carina Gwenlyn Perking, MD      PDMP not reviewed this encounter.   Barrett Henle, MD 10/14/22 1503    Barrett Henle, MD 10/14/22 [phone]

## 2022-10-14 NOTE — ED Triage Notes (Signed)
Pt c/o cough, SHOB/asthma, head/chest congestion started 12/29-NAD-steady gait

## 2022-10-15 LAB — SARS CORONAVIRUS 2 (TAT 6-24 HRS): SARS Coronavirus 2: NEGATIVE

## 2022-10-20 ENCOUNTER — Encounter: Payer: Self-pay | Admitting: Family Medicine

## 2022-10-20 ENCOUNTER — Ambulatory Visit: Payer: 59 | Admitting: Family Medicine

## 2022-10-20 VITALS — BP 138/86 | HR 74 | Temp 98.2°F | Resp 16 | Ht 71.0 in | Wt 314.6 lb

## 2022-10-20 DIAGNOSIS — J4540 Moderate persistent asthma, uncomplicated: Secondary | ICD-10-CM | POA: Diagnosis not present

## 2022-10-20 DIAGNOSIS — Z23 Encounter for immunization: Secondary | ICD-10-CM

## 2022-10-20 MED ORDER — FLUTICASONE PROPIONATE HFA 110 MCG/ACT IN AERO: 2.0000 | INHALATION_SPRAY | Freq: Two times a day (BID) | RESPIRATORY_TRACT | 1 refills | Status: DC

## 2022-10-20 MED ORDER — ALBUTEROL SULFATE HFA 108 (90 BASE) MCG/ACT IN AERS: 2.0000 | INHALATION_SPRAY | Freq: Four times a day (QID) | RESPIRATORY_TRACT | 2 refills | Status: DC | PRN

## 2022-10-20 MED ORDER — BUDESONIDE-FORMOTEROL FUMARATE 160-4.5 MCG/ACT IN AERO: INHALATION_SPRAY | RESPIRATORY_TRACT | 11 refills | Status: DC

## 2022-10-20 NOTE — Patient Instructions (Signed)
Let me know if you need refills.   Consider Pepcid/famotidine 20 mg twice daily as needed for the stomach discomfort.  Let us know if you need anything.

## 2022-10-20 NOTE — Progress Notes (Signed)
Chief Complaint  Patient presents with   Abdominal Pain   Back Pain   Follow-up    Subjective: Patient is a 42 y.o. male here for f/u.  Tx w Singulair 10 mg/d, Symbicort bid. He will use Flovent in his yellow zones. Rarely uses SABA unless he is sick. No wheezing, coughing, fevers, SOB. Has had a lingering chest pressure since getting sick last week. Went to ED, CXR neg. Not exertional. Getting better.   Past Medical History:  Diagnosis Date   Arthritis    Asthma    GERD (gastroesophageal reflux disease)    Heart murmur    Hyperlipidemia    Hypertension    OSA on CPAP     Objective: BP 138/86   Pulse 74   Temp 98.2 F (36.8 C)   Resp 16   Ht 5\' 11"  (1.803 m)   Wt (!) 314 lb 9.6 oz (142.7 kg)   SpO2 98%   BMI 43.88 kg/m  General: Awake, appears stated age Heart: RRR, no LE edema Lungs: CTAB, no rales, wheezes or rhonchi. No accessory muscle use GI: BS, mild ttp in epigastric pain.  Psych: Age appropriate judgment and insight, normal affect and mood  Assessment and Plan: Moderate persistent asthma without complication - Plan: budesonide-formoterol (SYMBICORT) 160-4.5 MCG/ACT inhaler  Need for influenza vaccination - Plan: Flu Vaccine QUAD 36+ mos IM (Fluarix, Fluzone & Afluria Quad PF  Chronic, stable. Cont Singulair 10 mg/d, Symbicort bid, SABA prn.  Flu shot today. Will get covid vaccine downstairs.  F/u in 6 mo for CPE or prn.  The patient voiced understanding and agreement to the plan.  [place], DO 10/20/22  11:16 AM

## 2022-11-04 ENCOUNTER — Encounter: Payer: Self-pay | Admitting: Family Medicine

## 2022-12-29 ENCOUNTER — Telehealth: Payer: Self-pay

## 2022-12-29 DIAGNOSIS — J4540 Moderate persistent asthma, uncomplicated: Secondary | ICD-10-CM

## 2022-12-29 MED ORDER — BUDESONIDE-FORMOTEROL FUMARATE 160-4.5 MCG/ACT IN AERO: INHALATION_SPRAY | RESPIRATORY_TRACT | 1 refills | Status: DC

## 2022-12-29 MED ORDER — FLUTICASONE PROPIONATE 50 MCG/ACT NA SUSP: 2.0000 | Freq: Every day | NASAL | 6 refills | Status: AC

## 2022-12-29 MED ORDER — FLUTICASONE PROPIONATE HFA 110 MCG/ACT IN AERO: 2.0000 | INHALATION_SPRAY | Freq: Two times a day (BID) | RESPIRATORY_TRACT | 1 refills | Status: DC

## 2022-12-29 NOTE — Telephone Encounter (Signed)
Sent in Patient informed 

## 2022-12-29 NOTE — Telephone Encounter (Signed)
Initial Comment Caller states he lost his inhaler and he doesn't have any refills. Caller state she doesn't have any symptoms. Translation No Nurse Assessment Nurse: Alford Highland, RN, Magda Paganini Date/Time Eilene Ghazi Time): 12/28/2022 8:25:09 PM Please select the assessment type ---Refill Additional Documentation ---Caller states he lost his albuterol inhaler for asthma and he doesn't have any refills. No new/worsening symptoms. Going out of town tomorrow. Dr. Nani Ravens is the one who prescribes it. His normal pharmacy is [facility] [phone]. Was requesting it to be sent to a different but advised i am unable to send to a pharmacy other than the original Does the patient have enough medication to last until the office opens? ---Unable to obtain loaner dose from Pharmacy Does the client directives allow for assistance with medications after hours? ---No Additional Documentation ---Advised caller to contact office first thing in the morning to have refill called in [place]. Time Eilene Ghazi Time) Disposition Final User 12/28/2022 8:21:08 PM Send To RN Personal Dineen Kid 12/28/2022 8:29:47 PM Clinical Call Yes Alford Highland RN, [facility] Final Disposition 12/28/2022 8:29:47 PM Clinical Call Yes Alford Highland, RN, Magda Paganini

## 2023-01-23 ENCOUNTER — Other Ambulatory Visit: Payer: Self-pay | Admitting: Family Medicine

## 2023-03-11 ENCOUNTER — Encounter: Payer: Self-pay | Admitting: Physician Assistant

## 2023-03-11 ENCOUNTER — Ambulatory Visit: Payer: 59 | Admitting: Physician Assistant

## 2023-03-11 VITALS — BP 164/100 | HR 70 | Temp 98.6°F | Ht 71.0 in | Wt 305.4 lb

## 2023-03-11 DIAGNOSIS — I10 Essential (primary) hypertension: Secondary | ICD-10-CM | POA: Diagnosis not present

## 2023-03-11 DIAGNOSIS — J029 Acute pharyngitis, unspecified: Secondary | ICD-10-CM

## 2023-03-11 LAB — POCT RAPID STREP A (OFFICE): Rapid Strep A Screen: POSITIVE — AB

## 2023-03-11 MED ORDER — AMLODIPINE BESYLATE 10 MG PO TABS: 10.0000 mg | ORAL_TABLET | Freq: Every day | ORAL | 0 refills | Status: DC

## 2023-03-11 MED ORDER — AMOXICILLIN 500 MG PO CAPS: 500.0000 mg | ORAL_CAPSULE | Freq: Two times a day (BID) | ORAL | 0 refills | Status: AC

## 2023-03-11 NOTE — Patient Instructions (Signed)
It was great to see you!  Upper respiratory infection recommendations for those with current or history of elevated blood pressure: 1. Avoid all over-the-counter antihistamines except Claritin/Loratadine and Zyrtec/Cetrizine. 2. Avoid all combination including cold sinus allergies flu decongestant and sleep medications 3. You can use Robitussin DM Mucinex and Mucinex DM for cough.  4. You can use any Coricidin HBP cold products  Avoid excessive ibuprofen   Check BP and follow-up with PCP  Restart amlodopine 10 mg daily   Start amoxicillin 500 mg twice daily for your strep throat Toss toothbrush after completion of treatment  Follow-up with your PCP in 2-4 weeks to reassess your blood pressure.  Take care,  Jarold Motto PA-C

## 2023-03-11 NOTE — Progress Notes (Signed)
Charles Collier is a 42 y.o. male here for a new problem.  History of Present Illness:   Chief Complaint  Patient presents with   Sore Throat    Pt c/o sore throat, slight headache, ear pressure, post nasal drip started last Tuesday 5/21, no nausea, no fever or chills. Has been Alka-seltzer day and night, Ibuprofen.    Strep Throat Has been experiencing sore throat, left ear pain, bilateral ear pressure, cough, post nasal drip, headache since 03/03/23. Sore throat is the worst symptom. Denies mucus production when blowing nose. Denies fever, chills. Taking ibuprofen 1200 mg, Alka-Seltzer. Denies sick contact. Uses CPAP.  Hypertension Previously treated with losartan, metoprolol. Blood pressure elevated today at 164/100. Takes Allegra.  Past Medical History:  Diagnosis Date   Arthritis    Asthma    GERD (gastroesophageal reflux disease)    Heart murmur    Hyperlipidemia    Hypertension    OSA on CPAP      Social History   Tobacco Use   Smoking status: Former    Packs/day: 0.50    Years: 16.00    Additional pack years: 0.00    Total pack years: 8.00    Types: Cigarettes    Quit date: 02/10/2017    Years since quitting: 6.0   Smokeless tobacco: Never  Vaping Use   Vaping Use: Former  Substance Use Topics   Alcohol use: Yes    Comment: occ   Drug use: No    Past Surgical History:  Procedure Laterality Date   ANTERIOR CRUCIATE LIGAMENT REPAIR      Family History  Problem Relation Age of Onset   Hypertension Mother    Hypertension Sister    Hypertension Maternal Grandmother    Hyperlipidemia Maternal Grandmother    Cancer Maternal Grandfather        prostate   Hearing loss Maternal Grandfather    Hypertension Maternal Grandfather    Hypertension Paternal Grandmother    Hypertension Paternal Grandfather    Colon cancer Neg Hx     Allergies  Allergen Reactions   Peanuts [Peanut Oil] Shortness Of Breath    Current Medications:   Current Outpatient  Medications:    albuterol (PROVENTIL) (2.5 MG/3ML) 0.083% nebulizer solution, INHALE 3 ML BY NEBULIZATION EVERY 6 HOURS AS NEEDED FOR WHEEZING OR SHORTNESS OF BREATH, Disp: 75 mL, Rfl: 0   albuterol (VENTOLIN HFA) 108 (90 Base) MCG/ACT inhaler, TAKE 2 PUFFS BY MOUTH EVERY 6 HOURS AS NEEDED, Disp: 6.7 each, Rfl: 2   amLODipine (NORVASC) 10 MG tablet, Take 1 tablet (10 mg total) by mouth daily., Disp: 30 tablet, Rfl: 0   amoxicillin (AMOXIL) 500 MG capsule, Take 1 capsule (500 mg total) by mouth 2 (two) times daily for 10 days., Disp: 20 capsule, Rfl: 0   anastrozole (ARIMIDEX) 1 MG tablet, Take 1 tablet by mouth daily., Disp: , Rfl: 10   budesonide-formoterol (SYMBICORT) 160-4.5 MCG/ACT inhaler, INHALE 2 PUFFS BY MOUTH INTO THE LUNGS EVERY 12 HOURS, Disp: 10.2 each, Rfl: 1   COVID-19 mRNA bivalent vaccine, Pfizer, (PFIZER COVID-19 VAC BIVALENT) injection, Inject into the muscle., Disp: 0.3 mL, Rfl: 0   EPINEPHrine 0.3 mg/0.3 mL IJ SOAJ injection, Inject 0.3 mLs (0.3 mg total) into the muscle as needed for anaphylaxis., Disp: 1 each, Rfl: 2   fluticasone (FLONASE) 50 MCG/ACT nasal spray, Place 2 sprays into both nostrils daily., Disp: 16 g, Rfl: 6   fluticasone (FLOVENT HFA) 110 MCG/ACT inhaler, Inhale 2 puffs into the  lungs 2 (two) times daily when in your yellow zone. Rinse mouth out after use., Disp: 12 g, Rfl: 1   montelukast (SINGULAIR) 10 MG tablet, TAKE 1 TABLET BY MOUTH EVERYDAY AT BEDTIME, Disp: 30 tablet, Rfl: 8   tadalafil (CIALIS) 20 MG tablet, Take 0.5-1 tablets (10-20 mg total) by mouth every other day as needed for erectile dysfunction., Disp: 30 tablet, Rfl: 2   Review of Systems:   Review of Systems  Constitutional:  Negative for chills, fever and malaise/fatigue.  HENT:  Positive for ear pain and sore throat. Negative for congestion.   Eyes:  Negative for blurred vision.  Respiratory:  Positive for cough. Negative for shortness of breath.   Cardiovascular:  Negative for chest  pain, palpitations and leg swelling.  Gastrointestinal:  Negative for vomiting.  Musculoskeletal:  Negative for back pain.  Skin:  Negative for rash.       (+) Post nasal drip  Neurological:  Positive for headaches. Negative for loss of consciousness.    Vitals:   Vitals:   03/11/23 1534 03/11/23 1549  BP: (!) 180/100 (!) 164/100  Pulse: 70   Temp: 98.6 F (37 C)   TempSrc: Temporal   SpO2: 98%   Weight: (!) 305 lb 6.1 oz (138.5 kg)   Height: 5\' 11"  (1.803 m)      Body mass index is 42.59 kg/m.  Physical Exam:   Physical Exam  Assessment and Plan:   ***   I,Alexander Ruley,acting as a scribe for Jarold Motto, PA.,have documented all relevant documentation on the behalf of Jarold Motto, PA,as directed by  Jarold Motto, PA while in the presence of Jarold Motto, [place].   ***   Jarold Motto, PA-C

## 2023-03-13 ENCOUNTER — Telehealth: Payer: Self-pay | Admitting: Family Medicine

## 2023-03-13 NOTE — Telephone Encounter (Signed)
He will need an appointment

## 2023-03-13 NOTE — Telephone Encounter (Signed)
Pt called to request refills on telmisartan and metoprolol. Pt said he was taken off of it but his blood pressure has gone back up. Pt requesting refills to be sent to [facility]

## 2023-03-20 ENCOUNTER — Encounter: Payer: Self-pay | Admitting: Family Medicine

## 2023-03-20 ENCOUNTER — Ambulatory Visit: Payer: 59 | Admitting: Family Medicine

## 2023-03-20 VITALS — BP 122/78 | HR 98 | Temp 98.4°F | Ht 70.0 in | Wt 307.0 lb

## 2023-03-20 DIAGNOSIS — J4530 Mild persistent asthma, uncomplicated: Secondary | ICD-10-CM

## 2023-03-20 DIAGNOSIS — I10 Essential (primary) hypertension: Secondary | ICD-10-CM

## 2023-03-20 MED ORDER — AMLODIPINE BESYLATE 10 MG PO TABS: 10.0000 mg | ORAL_TABLET | Freq: Every day | ORAL | 2 refills | Status: DC

## 2023-03-20 MED ORDER — MONTELUKAST SODIUM 10 MG PO TABS: ORAL_TABLET | ORAL | 2 refills | Status: DC

## 2023-03-20 MED ORDER — ALBUTEROL SULFATE (2.5 MG/3ML) 0.083% IN NEBU: INHALATION_SOLUTION | RESPIRATORY_TRACT | 2 refills | Status: AC

## 2023-03-20 NOTE — Patient Instructions (Signed)
Keep the diet clean and stay active.  Let us know if you need anything. 

## 2023-03-20 NOTE — Progress Notes (Signed)
Chief Complaint  Patient presents with   Follow-up    Blood pressure     Subjective Charles Collier is a 42 y.o. male who presents for hypertension follow up. He does monitor home blood pressures. Blood pressures ranging from 120's/70's on average. He is compliant with medication- Norvasc 10 mg/d. Patient has these side effects of medication: none He is usually adhering to a healthy diet overall. Current exercise: walking No CP or SOB.    Past Medical History:  Diagnosis Date   Arthritis    Asthma    GERD (gastroesophageal reflux disease)    Heart murmur    Hyperlipidemia    Hypertension    OSA on CPAP     Exam BP 122/78 (BP Location: Left Arm, Patient Position: Sitting, Cuff Size: Large)   Pulse 98   Temp 98.4 F (36.9 C) (Oral)   Ht 5\' 10"  (1.778 m)   Wt (!) 307 lb (139.3 kg)   SpO2 97%   BMI 44.05 kg/m  General:  well developed, well nourished, in no apparent distress Heart: RRR, no bruits, no LE edema Lungs: clear to auscultation, no accessory muscle use Psych: well oriented with normal range of affect and appropriate judgment/insight  Essential hypertension  Chronic, now stable. Cont Norvasc 10 mg/d. Counseled on diet and exercise. F/u in in 5 weeks for a CPE. The patient voiced understanding and agreement to the plan.  Jilda Roche [place], DO 03/20/23  1:20 PM

## 2023-03-20 NOTE — Addendum Note (Signed)
Addended by: Scharlene Gloss B on: 03/20/2023 01:43 PM   Modules accepted: Orders

## 2023-04-12 ENCOUNTER — Other Ambulatory Visit: Payer: Self-pay | Admitting: Family Medicine

## 2023-05-01 ENCOUNTER — Ambulatory Visit (INDEPENDENT_AMBULATORY_CARE_PROVIDER_SITE_OTHER): Payer: 59 | Admitting: Family Medicine

## 2023-05-01 ENCOUNTER — Encounter: Payer: Self-pay | Admitting: Family Medicine

## 2023-05-01 VITALS — BP 120/84 | HR 59 | Temp 98.4°F | Ht 70.0 in | Wt 305.0 lb

## 2023-05-01 DIAGNOSIS — Z6841 Body Mass Index (BMI) 40.0 and over, adult: Secondary | ICD-10-CM | POA: Diagnosis not present

## 2023-05-01 DIAGNOSIS — Z0000 Encounter for general adult medical examination without abnormal findings: Secondary | ICD-10-CM

## 2023-05-01 LAB — CBC
MCH: 27.9 pg (ref 27.0–33.0)
MPV: 9.6 fL (ref 7.5–12.5)
Platelets: 324 10*3/uL (ref 140–400)
RBC: 5.17 10*6/uL (ref 4.20–5.80)
WBC: 6.9 10*3/uL (ref 3.8–10.8)

## 2023-05-01 MED ORDER — BUPROPION HCL ER (XL) 150 MG PO TB24: 150.0000 mg | ORAL_TABLET | Freq: Every day | ORAL | 1 refills | Status: DC

## 2023-05-01 NOTE — Patient Instructions (Signed)
Give us 2-3 business days to get the results of your labs back.   Keep the diet clean and stay active.  Please get me a copy of your advanced directive form at your convenience.   Let us know if you need anything.  

## 2023-05-01 NOTE — Progress Notes (Signed)
Chief Complaint  Patient presents with   Annual Exam    Well Male Charles Collier is here for a complete physical.   His last physical was >1 year ago.  Current diet: in general, diet is OK.   Current exercise: resistance training Weight trend: stable Fatigue out of ordinary? No. Seat belt? Yes.   Advanced directive? No  Health maintenance Tetanus- Yes HIV- Yes Hep C- Yes  Morbid obesity Describes himself as an "emotional eater". In hindsight, this has been going on for 24 yrs. Has always tried to manage it on his own. Has never been thru a supervised wt loss program or tried a medication to help him lose weight.   Past Medical History:  Diagnosis Date   Arthritis    Asthma    GERD (gastroesophageal reflux disease)    Heart murmur    Hyperlipidemia    Hypertension    OSA on CPAP      Past Surgical History:  Procedure Laterality Date   ANTERIOR CRUCIATE LIGAMENT REPAIR      Medications  Current Outpatient Medications on File Prior to Visit  Medication Sig Dispense Refill   albuterol (PROVENTIL) (2.5 MG/3ML) 0.083% nebulizer solution INHALE 3 ML BY NEBULIZATION EVERY 6 HOURS AS NEEDED FOR WHEEZING OR SHORTNESS OF BREATH 75 mL 2   albuterol (VENTOLIN HFA) 108 (90 Base) MCG/ACT inhaler INHALE 2 PUFFS BY MOUTH EVERY 6 HOURS AS NEEDED 8.5 each 2   amLODipine (NORVASC) 10 MG tablet Take 1 tablet (10 mg total) by mouth daily. 90 tablet 2   anastrozole (ARIMIDEX) 1 MG tablet Take 1 tablet by mouth daily.  10   budesonide-formoterol (SYMBICORT) 160-4.5 MCG/ACT inhaler INHALE 2 PUFFS BY MOUTH INTO THE LUNGS EVERY 12 HOURS 10.2 each 1   EPINEPHrine 0.3 mg/0.3 mL IJ SOAJ injection Inject 0.3 mLs (0.3 mg total) into the muscle as needed for anaphylaxis. 1 each 2   fluticasone (FLONASE) 50 MCG/ACT nasal spray Place 2 sprays into both nostrils daily. 16 g 6   fluticasone (FLOVENT HFA) 110 MCG/ACT inhaler Inhale 2 puffs into the lungs 2 (two) times daily when in your yellow zone.  Rinse mouth out after use. 12 g 1   montelukast (SINGULAIR) 10 MG tablet TAKE 1 TABLET BY MOUTH EVERYDAY AT BEDTIME 90 tablet 2   tadalafil (CIALIS) 20 MG tablet Take 0.5-1 tablets (10-20 mg total) by mouth every other day as needed for erectile dysfunction. 30 tablet 2   Allergies Allergies  Allergen Reactions   Peanuts [Peanut Oil] Shortness Of Breath    Family History Family History  Problem Relation Age of Onset   Hypertension Mother    Hypertension Sister    Hypertension Maternal Grandmother    Hyperlipidemia Maternal Grandmother    Cancer Maternal Grandfather        prostate   Hearing loss Maternal Grandfather    Hypertension Maternal Grandfather    Hypertension Paternal Grandmother    Hypertension Paternal Grandfather    Colon cancer Neg Hx     Review of Systems: Constitutional: no fevers or chills Eye:  no recent significant change in vision Ear/Nose/Mouth/Throat:  Ears:  no hearing loss Nose/Mouth/Throat:  no complaints of nasal congestion, no sore throat Cardiovascular:  no chest pain Respiratory:  no shortness of breath Gastrointestinal:  no abdominal pain, no change in bowel habits GU:  Male: negative for dysuria, frequency, and incontinence Musculoskeletal/Extremities:  no pain of the joints Integumentary (Skin/Breast):  no abnormal skin lesions reported Neurologic:  no headaches Endocrine: No unexpected weight changes Hematologic/Lymphatic:  no night sweats  Exam BP 120/84 (BP Location: Left Arm, Patient Position: Sitting, Cuff Size: Large)   Pulse (!) 59   Temp 98.4 F (36.9 C) (Oral)   Ht 5\' 10"  (1.778 m)   Wt (!) 305 lb (138.3 kg)   SpO2 97%   BMI 43.76 kg/m  General:  well developed, well nourished, in no apparent distress Skin:  no significant moles, warts, or growths Head:  no masses, lesions, or tenderness Eyes:  pupils equal and round, sclera anicteric without injection Ears:  canals without lesions, TMs shiny without retraction, no obvious  effusion, no erythema Nose:  nares patent, mucosa normal Throat/Pharynx:  lips and gingiva without lesion; tongue and uvula midline; non-inflamed pharynx; no exudates or postnasal drainage Neck: neck supple without adenopathy, thyromegaly, or masses Lungs:  clear to auscultation, breath sounds equal bilaterally, no respiratory distress Cardio:  regular rate and rhythm, no bruits, no LE edema Abdomen:  abdomen soft, nontender; bowel sounds normal; no masses or organomegaly Rectal: Deferred Musculoskeletal:  symmetrical muscle groups noted without atrophy or deformity Extremities:  no clubbing, cyanosis, or edema, no deformities, no skin discoloration Neuro:  gait normal; deep tendon reflexes normal and symmetric Psych: well oriented with normal range of affect and appropriate judgment/insight  Assessment and Plan  Well adult exam  Morbid obesity (HCC) - Plan: buPROPion (WELLBUTRIN XL) 150 MG 24 hr tablet   Well 42 y.o. male. Counseled on diet and exercise. Counseled on risks and benefits of prostate cancer screening with PSA. The patient agrees to forego screening.  Advanced directive form provided today.  Other orders as above. Morbid obesity: Chronic, uncontrolled. Reports emotional eating. Will trial Wellbutrin XL 150 mg/d. If no improvement, would consider injectable options. Consider adding more cardio to strength training. Don't forget to stretch.  Follow up in 6 weeks pending the above workup. The patient voiced understanding and agreement to the plan.  Jilda Roche [place], DO 05/01/23 9:09 AM

## 2023-05-02 ENCOUNTER — Other Ambulatory Visit: Payer: Self-pay | Admitting: Family Medicine

## 2023-05-02 LAB — CBC
HCT: 44 % (ref 38.5–50.0)
Hemoglobin: 14.4 g/dL (ref 13.2–17.1)
MCHC: 32.7 g/dL (ref 32.0–36.0)
MCV: 85.1 fL (ref 80.0–100.0)
RDW: 14.2 % (ref 11.0–15.0)

## 2023-05-02 LAB — COMPREHENSIVE METABOLIC PANEL
AG Ratio: 1.7 (calc) (ref 1.0–2.5)
ALT: 21 U/L (ref 9–46)
AST: 21 U/L (ref 10–40)
Albumin: 4.3 g/dL (ref 3.6–5.1)
Alkaline phosphatase (APISO): 66 U/L (ref 36–130)
BUN: 12 mg/dL (ref 7–25)
CO2: 25 mmol/L (ref 20–32)
Calcium: 9.2 mg/dL (ref 8.6–10.3)
Chloride: 110 mmol/L (ref 98–110)
Creat: 1.26 mg/dL (ref 0.60–1.29)
Globulin: 2.5 g/dL (calc) (ref 1.9–3.7)
Glucose, Bld: 91 mg/dL (ref 65–99)
Potassium: 4.4 mmol/L (ref 3.5–5.3)
Sodium: 142 mmol/L (ref 135–146)
Total Bilirubin: 0.3 mg/dL (ref 0.2–1.2)
Total Protein: 6.8 g/dL (ref 6.1–8.1)

## 2023-05-02 LAB — LIPID PANEL
Cholesterol: 155 mg/dL (ref <200)
HDL: 47 mg/dL (ref >=40)
LDL Cholesterol (Calc): 94 mg/dL (calc)
Non-HDL Cholesterol (Calc): 108 mg/dL (calc) (ref <130)
Total CHOL/HDL Ratio: 3.3 (calc) (ref <5.0)
Triglycerides: 46 mg/dL (ref <150)

## 2023-05-13 ENCOUNTER — Encounter (INDEPENDENT_AMBULATORY_CARE_PROVIDER_SITE_OTHER): Payer: Self-pay

## 2023-05-23 ENCOUNTER — Other Ambulatory Visit: Payer: Self-pay | Admitting: Family Medicine

## 2023-06-05 ENCOUNTER — Telehealth: Payer: Self-pay | Admitting: Family Medicine

## 2023-06-05 MED ORDER — FLUTICASONE PROPIONATE HFA 110 MCG/ACT IN AERO: 2.0000 | INHALATION_SPRAY | Freq: Every day | RESPIRATORY_TRACT | 1 refills | Status: AC

## 2023-06-05 MED ORDER — ALBUTEROL SULFATE HFA 108 (90 BASE) MCG/ACT IN AERS: 2.0000 | INHALATION_SPRAY | Freq: Four times a day (QID) | RESPIRATORY_TRACT | 2 refills | Status: DC | PRN

## 2023-06-05 NOTE — Telephone Encounter (Signed)
Prescription Request  06/05/2023  Is this a "Controlled Substance" medicine? No  LOV: 05/01/2023  What is the name of the medication or equipment? albuterol (VENTOLIN HFA) 108 (90 Base) MCG/ACT inhaler & FLOVENT HFA 110 MCG/ACT inhaler  Have you contacted your pharmacy to request a refill? No   Which pharmacy would you like this sent to?  [id number] [place], [place] - [address] [place] [address] Phone: [phone] Fax: [phone]    Patient notified that their request is being sent to the clinical staff for review and that they should receive a response within 2 business days.   Please advise at [facility] [phone]

## 2023-06-05 NOTE — Telephone Encounter (Signed)
Sent in and patient aware 

## 2023-06-05 NOTE — Addendum Note (Signed)
Addended by: Scharlene Gloss B on: 06/05/2023 09:49 AM   Modules accepted: Orders

## 2023-06-12 ENCOUNTER — Ambulatory Visit: Payer: 59 | Admitting: Family Medicine

## 2023-06-22 ENCOUNTER — Ambulatory Visit: Payer: 59 | Admitting: Family Medicine

## 2023-06-22 DIAGNOSIS — H6592 Unspecified nonsuppurative otitis media, left ear: Secondary | ICD-10-CM | POA: Diagnosis not present

## 2023-06-22 DIAGNOSIS — Z6841 Body Mass Index (BMI) 40.0 and over, adult: Secondary | ICD-10-CM

## 2023-06-22 MED ORDER — SEMAGLUTIDE-WEIGHT MANAGEMENT 2.4 MG/0.75ML ~~LOC~~ SOAJ: 2.4000 mg | SUBCUTANEOUS | 0 refills | Status: AC

## 2023-06-22 MED ORDER — PREDNISONE 20 MG PO TABS: 40.0000 mg | ORAL_TABLET | Freq: Every day | ORAL | 0 refills | Status: AC

## 2023-06-22 MED ORDER — SEMAGLUTIDE-WEIGHT MANAGEMENT 1 MG/0.5ML ~~LOC~~ SOAJ: 1.0000 mg | SUBCUTANEOUS | 0 refills | Status: AC

## 2023-06-22 MED ORDER — SEMAGLUTIDE-WEIGHT MANAGEMENT 1.7 MG/0.75ML ~~LOC~~ SOAJ: 1.7000 mg | SUBCUTANEOUS | 0 refills | Status: AC

## 2023-06-22 MED ORDER — SEMAGLUTIDE-WEIGHT MANAGEMENT 0.5 MG/0.5ML ~~LOC~~ SOAJ: 0.5000 mg | SUBCUTANEOUS | 0 refills | Status: AC

## 2023-06-22 MED ORDER — SEMAGLUTIDE-WEIGHT MANAGEMENT 0.25 MG/0.5ML ~~LOC~~ SOAJ: 0.2500 mg | SUBCUTANEOUS | 0 refills | Status: AC

## 2023-06-22 NOTE — Progress Notes (Signed)
Chief Complaint  Patient presents with   Follow-up    Sinus infection Ear pressure Wellbutrin is not working     Subjective: Patient is a 42 y.o. male here for f/u.  Was on Wellbutrin but it did not work. Diet is OK. Wt training for exercise, some cardio. Never been with supervised wt loss program. Has never taken any other rx wt loss med.   Duration: 1 week  Associated symptoms: subjective fever, sinus headache, rhinorrhea, ear fullness, sore throat, and fatigue Denies: sinus congestion, sinus pain, itchy watery eyes, ear pain, ear drainage, myalgia, and fevers, wheezing, SOB , coughing Treatment to date: Flonase, Nyquil Sick contacts: Yes; granddaughter  Past Medical History:  Diagnosis Date   Arthritis    Asthma    GERD (gastroesophageal reflux disease)    Heart murmur    Hyperlipidemia    Hypertension    OSA on CPAP     Objective: BP 120/82 (BP Location: Right Arm, Patient Position: Sitting, Cuff Size: Large)   Pulse 66   Temp 98.9 F (37.2 C) (Oral)   Ht 5\' 10"  (1.778 m)   Wt (!) 311 lb 8 oz (141.3 kg)   SpO2 96%   BMI 44.70 kg/m  General: Awake, appears stated age HEENT: Serous fluid noted behind left TM, canals patent bilaterally, TM on right is unremarkable, nares are patent without otorrhea, no sinus TTP, MMM, no pharyngeal exudate or erythema Heart: RRR, no LE edema Lungs: CTAB, no rales, wheezes or rhonchi. No accessory muscle use Psych: Age appropriate judgment and insight, normal affect and mood  Assessment and Plan: Morbid obesity (HCC) - Plan: Semaglutide-Weight Management 0.25 MG/0.5ML SOAJ, Semaglutide-Weight Management 0.5 MG/0.5ML SOAJ, Semaglutide-Weight Management 1 MG/0.5ML SOAJ, Semaglutide-Weight Management 1.7 MG/0.75ML SOAJ, Semaglutide-Weight Management 2.4 MG/0.75ML SOAJ  Fluid level behind tympanic membrane of left ear - Plan: predniSONE (DELTASONE) 20 MG tablet  Chronic, uncontrolled.  Start Wegovy 0.25 mg weekly and steadily  uptitrate.  Counseled on diet and exercise.  Follow-up in 6 weeks.  Stop Wellbutrin. 5-day prednisone burst 40 mg daily.  If no improvement the next 3 to 4 days, he will send me a message. The patient voiced understanding and agreement to the plan.  Jilda Roche [place], DO 06/22/23  11:26 AM

## 2023-06-22 NOTE — Patient Instructions (Addendum)
Let me know if there are cost/supply issues.   Keep the diet clean and stay active.  Continue to push fluids, practice good hand hygiene, and cover your mouth if you cough.  If you start having fevers, shaking or shortness of breath, seek immediate care.  Send me a message in 2-3 days if no better with the URI symptoms.   Claritin (loratadine), Allegra (fexofenadine), Zyrtec (cetirizine) which is also equivalent to Xyzal (levocetirizine); these are listed in order from weakest to strongest. Generic, and therefore cheaper, options are in the parentheses.   Flonase (fluticasone); nasal spray that is over the counter. 2 sprays each nostril, once daily. Aim towards the same side eye when you spray.  There are available OTC, and the generic versions, which may be cheaper, are in parentheses. Show this to a pharmacist if you have trouble finding any of these items.  Let us know if you need anything.

## 2023-08-03 ENCOUNTER — Ambulatory Visit: Payer: 59 | Admitting: Family Medicine

## 2023-09-04 ENCOUNTER — Other Ambulatory Visit: Payer: Self-pay | Admitting: Family Medicine

## 2023-12-07 ENCOUNTER — Ambulatory Visit (INDEPENDENT_AMBULATORY_CARE_PROVIDER_SITE_OTHER): Payer: BC Managed Care – PPO | Admitting: Family Medicine

## 2023-12-07 ENCOUNTER — Encounter: Payer: Self-pay | Admitting: Family Medicine

## 2023-12-07 VITALS — BP 160/98 | HR 74 | Temp 98.7°F | Resp 18 | Ht 70.0 in | Wt 315.8 lb

## 2023-12-07 DIAGNOSIS — N4889 Other specified disorders of penis: Secondary | ICD-10-CM | POA: Insufficient documentation

## 2023-12-07 LAB — POC URINALSYSI DIPSTICK (AUTOMATED)
Bilirubin, UA: NEGATIVE
Blood, UA: NEGATIVE
Glucose, UA: NEGATIVE
Ketones, UA: NEGATIVE
Leukocytes, UA: NEGATIVE
Nitrite, UA: NEGATIVE
Protein, UA: NEGATIVE
Spec Grav, UA: 1.01 (ref 1.010–1.025)
Urobilinogen, UA: 0.2 U/dL
pH, UA: 6 (ref 5.0–8.0)

## 2023-12-07 NOTE — Progress Notes (Signed)
 Established Patient Office Visit  Subjective   Patient ID: Charles Collier, male    DOB: Nov 11, 1980  Age: 43 y.o. MRN: [id number]  Chief Complaint  Patient presents with   Penis Pain    Pt states having penile discomfort, no burning, or discharge, almost 2 weeks ago. No rash     HPI Discussed the use of AI scribe software for clinical note transcription with the patient, who gave verbal consent to proceed.  History of Present Illness   Charles Collier is a 43 year old male who presents with penile discomfort.  He has been experiencing mild discomfort at the tip of the penis for approximately one and a half weeks. There is occasional itchiness at the tip, though he is uncertain if it is a real sensation or imagined.  No discharge, burning during urination, back pain, injury, scrotal pain, swelling, redness, rash, or itchiness. He denies any concerns for sexually transmitted diseases.      Patient Active Problem List   Diagnosis Date Noted   Penile pain 12/07/2023   Seasonal allergies 08/13/2020   Chronic left shoulder pain 12/24/2018   Morbid obesity (HCC) 06/22/2018   Asthma exacerbation 05/19/2018   Orthopnea 05/19/2018   Rib pain on right side 05/19/2018   Social anxiety disorder 05/19/2018   Low testosterone level in male 05/19/2018   Gastroesophageal reflux disease 04/20/2018   Mild persistent asthma 01/26/2018   Status asthmaticus 02/21/2017   Moderate persistent asthma 02/21/2017   Moderate persistent asthma with status asthmaticus 02/20/2017   Essential hypertension 03/19/2013   Past Medical History:  Diagnosis Date   Arthritis    Asthma    GERD (gastroesophageal reflux disease)    Heart murmur    Hyperlipidemia    Hypertension    OSA on CPAP    Past Surgical History:  Procedure Laterality Date   ANTERIOR CRUCIATE LIGAMENT REPAIR     Social History   Tobacco Use   Smoking status: Former    Current packs/day: 0.00    Average packs/day: 0.5 packs/day  for 16.0 years (8.0 ttl pk-yrs)    Types: Cigarettes    Start date: 02/10/2001    Quit date: 02/10/2017    Years since quitting: 6.8   Smokeless tobacco: Never  Vaping Use   Vaping status: Former  Substance Use Topics   Alcohol use: Yes    Comment: occ   Drug use: No   Social History   Socioeconomic History   Marital status: Married    Spouse name: Not on file   Number of children: Not on file   Years of education: Not on file   Highest education level: 12th grade  Occupational History   Not on file  Tobacco Use   Smoking status: Former    Current packs/day: 0.00    Average packs/day: 0.5 packs/day for 16.0 years (8.0 ttl pk-yrs)    Types: Cigarettes    Start date: 02/10/2001    Quit date: 02/10/2017    Years since quitting: 6.8   Smokeless tobacco: Never  Vaping Use   Vaping status: Former  Substance and Sexual Activity   Alcohol use: Yes    Comment: occ   Drug use: No   Sexual activity: Not on file  Other Topics Concern   Not on file  Social History Narrative   Not on file   Social Drivers of Health   Financial Resource Strain: Low Risk  (12/07/2023)   Overall Financial Resource Strain (CARDIA)  Difficulty of Paying Living Expenses: Not very hard  Food Insecurity: Food Insecurity Present (12/07/2023)   Hunger Vital Sign    Worried About Running Out of Food in the Last Year: Sometimes true    Ran Out of Food in the Last Year: Sometimes true  Transportation Needs: No Transportation Needs (12/07/2023)   PRAPARE - Administrator, Civil Service (Medical): No    Lack of Transportation (Non-Medical): No  Physical Activity: Insufficiently Active (12/07/2023)   Exercise Vital Sign    Days of Exercise per Week: 3 days    Minutes of Exercise per Session: 30 min  Stress: No Stress Concern Present (12/07/2023)   [employer] of Occupational Health - Occupational Stress Questionnaire    Feeling of Stress : Only a little  Social Connections: Moderately  Integrated (12/07/2023)   Social Connection and Isolation Panel [NHANES]    Frequency of Communication with Friends and Family: Three times a week    Frequency of Social Gatherings with Friends and Family: Once a week    Attends Religious Services: 1 to 4 times per year    Active Member of [employer] or Organizations: No    Attends Engineer, structural: Not on file    Marital Status: Married  Catering manager Violence: Not on file   Family Status  Relation Name Status   Mother  Alive   Father  Alive   Sister  Alive   MGM  Deceased   MGF  Alive   PGM  Alive   PGF  Deceased   Neg Hx  (Not Specified)  No partnership data on file   Family History  Problem Relation Age of Onset   Hypertension Mother    Hypertension Sister    Hypertension Maternal Grandmother    Hyperlipidemia Maternal Grandmother    Cancer Maternal Grandfather 82       prostate   Hearing loss Maternal Grandfather    Hypertension Maternal Grandfather    Hypertension Paternal Grandmother    Hypertension Paternal Grandfather    Colon cancer Neg Hx    Allergies  Allergen Reactions   Peanuts [Peanut Oil] Shortness Of Breath      Review of Systems  Constitutional:  Negative for chills, fever and malaise/fatigue.  HENT:  Negative for congestion and hearing loss.   Eyes:  Negative for blurred vision and discharge.  Respiratory:  Negative for cough, sputum production and shortness of breath.   Cardiovascular:  Negative for chest pain, palpitations and leg swelling.  Gastrointestinal:  Negative for abdominal pain, blood in stool, constipation, diarrhea, heartburn, nausea and vomiting.  Genitourinary:  Negative for dysuria, frequency, hematuria and urgency.  Musculoskeletal:  Negative for back pain, falls and myalgias.  Skin:  Negative for rash.  Neurological:  Negative for dizziness, sensory change, loss of consciousness, weakness and headaches.  Endo/Heme/Allergies:  Negative for environmental allergies.  Does not bruise/bleed easily.  Psychiatric/Behavioral:  Negative for depression and suicidal ideas. The patient is not nervous/anxious and does not have insomnia.       Objective:     BP (!) 160/98 (BP Location: Left Arm, Patient Position: Sitting, Cuff Size: Large)   Pulse 74   Temp 98.7 F (37.1 C) (Oral)   Resp 18   Ht 5\' 10"  (1.778 m)   Wt (!) 315 lb 12.8 oz (143.2 kg)   SpO2 97%   BMI 45.31 kg/m  BP Readings from Last 3 Encounters:  12/07/23 (!) 160/98  06/22/23 120/82  05/01/23  120/84   Wt Readings from Last 3 Encounters:  12/07/23 (!) 315 lb 12.8 oz (143.2 kg)  06/22/23 (!) 311 lb 8 oz (141.3 kg)  05/01/23 (!) 305 lb (138.3 kg)   SpO2 Readings from Last 3 Encounters:  12/07/23 97%  06/22/23 96%  05/01/23 97%      Physical Exam Vitals and nursing note reviewed.  Constitutional:      General: He is not in acute distress.    Appearance: Normal appearance. He is well-developed.  HENT:     Head: Normocephalic and atraumatic.  Eyes:     General: No scleral icterus.       Right eye: No discharge.        Left eye: No discharge.  Cardiovascular:     Rate and Rhythm: Normal rate and regular rhythm.     Heart sounds: No murmur heard. Pulmonary:     Effort: Pulmonary effort is normal. No respiratory distress.     Breath sounds: Normal breath sounds.  Genitourinary:    Penis: Tenderness present. No erythema or discharge.      Comments: Tenderness at tip of penis No erythema or d/c  Swab of penis done  Musculoskeletal:        General: Normal range of motion.     Cervical back: Normal range of motion and neck supple.     Right lower leg: No edema.     Left lower leg: No edema.  Skin:    General: Skin is warm and dry.  Neurological:     Mental Status: He is alert and oriented to person, place, and time.  Psychiatric:        Mood and Affect: Mood normal.        Behavior: Behavior normal.        Thought Content: Thought content normal.        Judgment:  Judgment normal.      Results for orders placed or performed in visit on 12/07/23  POCT Urinalysis Dipstick (Automated)  Result Value Ref Range   Color, UA yellow    Clarity, UA clear    Glucose, UA Negative Negative   Bilirubin, UA neg    Ketones, UA neg    Spec Grav, UA 1.010 1.010 - 1.025   Blood, UA neg    pH, UA 6.0 5.0 - 8.0   Protein, UA Negative Negative   Urobilinogen, UA 0.2 0.2 or 1.0 E.U./dL   Nitrite, UA neg    Leukocytes, UA Negative Negative    Last CBC Lab Results  Component Value Date   WBC 6.9 05/01/2023   HGB 14.4 05/01/2023   HCT 44.0 05/01/2023   MCV 85.1 05/01/2023   MCH 27.9 05/01/2023   RDW 14.2 05/01/2023   PLT 324 05/01/2023   Last metabolic panel Lab Results  Component Value Date   GLUCOSE 91 05/01/2023   NA 142 05/01/2023   K 4.4 05/01/2023   CL 110 05/01/2023   CO2 25 05/01/2023   BUN 12 05/01/2023   CREATININE 1.26 05/01/2023   GFR 69.55 04/21/2022   CALCIUM 9.2 05/01/2023   PROT 6.8 05/01/2023   ALBUMIN 4.2 04/21/2022   BILITOT 0.3 05/01/2023   ALKPHOS 69 04/21/2022   AST 21 05/01/2023   ALT 21 05/01/2023   ANIONGAP 9 02/22/2017   Last lipids Lab Results  Component Value Date   CHOL 155 05/01/2023   HDL 47 05/01/2023   LDLCALC 94 05/01/2023   TRIG 46 05/01/2023   CHOLHDL  3.3 05/01/2023   Last hemoglobin A1c No results found for: "HGBA1C" Last thyroid functions Lab Results  Component Value Date   TSH 0.397 02/22/2017   Last vitamin D No results found for: "25OHVITD2", "25OHVITD3", "VD25OH" Last vitamin B12 and Folate Lab Results  Component Value Date   VITAMINB12 586 12/24/2018      The 10-year ASCVD risk score (Arnett DK, et al., 2019) is: 8.7%    Assessment & Plan:   Problem List Items Addressed This Visit       Unprioritized   Penile pain - Primary   Ua negative Penile swab done and sent for culture  Refer to urology if everything comes back normal       Relevant Orders   POCT Urinalysis  Dipstick (Automated) (Completed)   C. trachomatis/N. gonorrhoeae RNA  Assessment and Plan    Penile Discomfort Mild discomfort at the penile tip has been present for 1.5 weeks, with no discharge, dysuria, back pain, or scrotal pain. There is no history of recent trauma or STD concerns, though occasional itching is noted. A physical examination of the penile area is necessary to evaluate for infection, irritation, or other urological conditions. Depending on the exam findings, consider urinalysis and STD screening. Provide education on potential causes and management. Discuss a follow-up plan based on examination and test results.        Return if symptoms worsen or fail to improve.    Donato Schultz, DO

## 2023-12-07 NOTE — Assessment & Plan Note (Signed)
 Ua negative Penile swab done and sent for culture  Refer to urology if everything comes back normal

## 2023-12-08 LAB — C. TRACHOMATIS/N. GONORRHOEAE RNA
C. trachomatis RNA, TMA: NOT DETECTED
N. gonorrhoeae RNA, TMA: NOT DETECTED

## 2023-12-14 ENCOUNTER — Encounter: Payer: Self-pay | Admitting: Family Medicine

## 2023-12-14 ENCOUNTER — Ambulatory Visit: Payer: Self-pay | Admitting: Family Medicine

## 2023-12-14 VITALS — BP 130/63 | HR 64 | Temp 98.8°F | Resp 16 | Ht 70.0 in | Wt 315.0 lb

## 2023-12-14 DIAGNOSIS — B356 Tinea cruris: Secondary | ICD-10-CM | POA: Diagnosis not present

## 2023-12-14 DIAGNOSIS — Z23 Encounter for immunization: Secondary | ICD-10-CM

## 2023-12-14 DIAGNOSIS — J4540 Moderate persistent asthma, uncomplicated: Secondary | ICD-10-CM

## 2023-12-14 DIAGNOSIS — L293 Anogenital pruritus, unspecified: Secondary | ICD-10-CM | POA: Diagnosis not present

## 2023-12-14 MED ORDER — ALBUTEROL SULFATE HFA 108 (90 BASE) MCG/ACT IN AERS: 2.0000 | INHALATION_SPRAY | Freq: Four times a day (QID) | RESPIRATORY_TRACT | 5 refills | Status: DC | PRN

## 2023-12-14 MED ORDER — CLOTRIMAZOLE 1 % EX CREA: 1.0000 | TOPICAL_CREAM | Freq: Two times a day (BID) | CUTANEOUS | 0 refills | Status: DC

## 2023-12-14 MED ORDER — BUDESONIDE-FORMOTEROL FUMARATE 160-4.5 MCG/ACT IN AERO: INHALATION_SPRAY | RESPIRATORY_TRACT | 1 refills | Status: DC

## 2023-12-14 MED ORDER — MONTELUKAST SODIUM 10 MG PO TABS: ORAL_TABLET | ORAL | 2 refills | Status: DC

## 2023-12-14 NOTE — Progress Notes (Signed)
 Chief Complaint  Patient presents with   Penile discomfort    Charles Collier is a 43 y.o. male here for a skin complaint.  Duration: 2 weeks Location: tip of penis Pruritic? Yes Painful? Pain over the tip Drainage? No New soaps/lotions/topicals/detergents? No Sick contacts? No Other associated symptoms: no fevers, urinary complaints, redness Tested neg for GC/chlamydia, trich, UTI Therapies tried thus far: Tinactin, Nystatin helped a little  Past Medical History:  Diagnosis Date   Arthritis    Asthma    GERD (gastroesophageal reflux disease)    Heart murmur    Hyperlipidemia    Hypertension    OSA on CPAP     BP 130/63 (BP Location: Right Arm, Patient Position: Sitting, Cuff Size: Large)   Pulse 64   Temp 98.8 F (37.1 C) (Oral)   Resp 16   Ht 5\' 10"  (1.778 m)   Wt (!) 315 lb (142.9 kg)   SpO2 98%   BMI 45.20 kg/m  Gen: awake, alert, appearing stated age Lungs: No accessory muscle use Skin: no ext genital lesions noted, no dc or test ttp. Hyperpigmentation of inguinal region without scale. No drainage, erythema, TTP, fluctuance, excoriation Psych: Age appropriate judgment and insight  Tinea cruris - Plan: clotrimazole (LOTRIMIN) 1 % cream  Needs flu shot - Plan: Flu vaccine trivalent PF, 6mos and older(Flulaval,Afluria,Fluarix,Fluzone)  Itching of penis  Moderate persistent asthma without complication - Plan: budesonide-formoterol (SYMBICORT) 160-4.5 MCG/ACT inhaler, albuterol (VENTOLIN HFA) 108 (90 Base) MCG/ACT inhaler, montelukast (SINGULAIR) 10 MG tablet  Clotrimazole bid for both jock itch and penile itching/discomfort. If not having sig improvement by next week, he will send me a message and we will place a referral to urology team. F/u in 5 months for a physical. The patient voiced understanding and agreement to the plan.  Jilda Roche [place], DO 12/14/23 7:56 AM

## 2023-12-14 NOTE — Patient Instructions (Addendum)
 Use clotrimazole 1% OTC twice daily for 14 days if insurance does not cover this.  Send me a message in 1 week if no better. We will get you in with the urology team.   Let us know if you need anything.

## 2024-01-21 ENCOUNTER — Telehealth: Payer: Self-pay

## 2024-01-21 NOTE — Telephone Encounter (Signed)
 Copied from CRM [id number]. Topic: Clinical - Medication Question >> Jan 21, 2024  1:48 PM Elizebeth Brooking wrote: Reason for CRM: Patient called in regarding the clotrimazole (LOTRIMIN) 1 % cream he was prescribed stating to call Dr.Wendling if it wasn't working and its not, would like to be sent a alternative or a higher dose    [phone]

## 2024-01-22 ENCOUNTER — Telehealth: Payer: Self-pay

## 2024-01-22 MED ORDER — CLOTRIMAZOLE-BETAMETHASONE 1-0.05 % EX CREA: 1.0000 | TOPICAL_CREAM | Freq: Every day | CUTANEOUS | 0 refills | Status: DC

## 2024-01-22 NOTE — Telephone Encounter (Signed)
Patient was advised and verbalized understanding. 

## 2024-01-22 NOTE — Telephone Encounter (Signed)
 Copied from CRM [id number]. Topic: Clinical - Prescription Issue >> Jan 22, 2024 11:16 AM Charles Collier wrote: Reason for CRM: Pt called to have medication changed clotrimazole (LOTRIMIN) 1 % cream  pt states its not working , would like something else. Please call pt back.

## 2024-01-22 NOTE — Telephone Encounter (Signed)
Different med sent. Ty.

## 2024-01-22 NOTE — Addendum Note (Signed)
 Addended by: Radene Gunning on: 01/22/2024 12:38 PM   Modules accepted: Orders

## 2024-02-24 ENCOUNTER — Telehealth: Admitting: Family Medicine

## 2024-02-24 ENCOUNTER — Other Ambulatory Visit: Payer: Self-pay | Admitting: Family Medicine

## 2024-02-24 ENCOUNTER — Encounter: Payer: Self-pay | Admitting: Family Medicine

## 2024-02-24 DIAGNOSIS — B356 Tinea cruris: Secondary | ICD-10-CM | POA: Diagnosis not present

## 2024-02-24 DIAGNOSIS — F411 Generalized anxiety disorder: Secondary | ICD-10-CM | POA: Diagnosis not present

## 2024-02-24 MED ORDER — ALBUTEROL SULFATE 108 (90 BASE) MCG/ACT IN AEPB: INHALATION_SPRAY | RESPIRATORY_TRACT | 2 refills | Status: DC

## 2024-02-24 MED ORDER — FLUCONAZOLE 150 MG PO TABS: ORAL_TABLET | ORAL | 0 refills | Status: DC

## 2024-02-24 NOTE — Progress Notes (Signed)
 CC: f/u  Subjective: Patient is a 43 y.o. male here for follow-up. We are interacting via web portal for an electronic face-to-face visit. I verified patient's ID using 2 identifiers. Patient agreed to proceed with visit via this method. Patient is at home, I am at office. Patient and I are present for visit.   Patient was seen a couple months ago and treated for balanitis.  He reports some improvement but feels like it is spreading to his urethra.  No pain, drainage, urinary complaints, fevers.  He is wondering if he can take anything oral.  Dealing with some stress.  Would like to speak with a therapist.  Wondering where he needs to start for this.  Past Medical History:  Diagnosis Date   Arthritis    Asthma    GERD (gastroesophageal reflux disease)    Heart murmur    Hyperlipidemia    Hypertension    OSA on CPAP     Objective: No conversational dyspnea Age appropriate judgment and insight Nml affect and mood  Assessment and Plan: Tinea cruris  GAD (generalized anxiety disorder) - Plan: Ambulatory referral to Psychology  Oral Diflucan 150 mg weekly for 4 doses.  Send message if no improvement and will refer to urology. Referral to behavioral health placed.  Message sent to him with counseling resources. The patient voiced understanding and agreement to the plan.  Shellie Dials [place], DO 02/24/24  2:32 PM

## 2024-02-25 ENCOUNTER — Telehealth: Payer: Self-pay

## 2024-02-25 ENCOUNTER — Other Ambulatory Visit (HOSPITAL_COMMUNITY): Payer: Self-pay

## 2024-02-25 NOTE — Telephone Encounter (Signed)
 Pt needs Proair - which his ins requires a PA for I believe.

## 2024-02-25 NOTE — Telephone Encounter (Signed)
 Called and spoke with pharmacist to verify insurance, and she stated that pt has picked up Ventolin  inhaler today and stated that PA for proair  is no longer needed. Confirming with clinical team in original RX request message

## 2024-02-25 NOTE — Telephone Encounter (Signed)
 Looks like therapy has changed to ventolin , is PA still required? If not, Please sign off on rx in this encounter as PA team is unable to resolve RX requests. Thank you

## 2024-02-25 NOTE — Telephone Encounter (Signed)
 Copied from CRM [id number]. Topic: Clinical - Medication Prior Auth >> Feb 25, 2024 11:05 AM Allyne Areola wrote: Reason for CRM: Patient was prescribed Albuterol  Sulfate (PROAIR  RESPICLICK) 108 (90 Base) MCG/ACT AEPB and per patient the pharmacy cannot fill it and asked patient to follow up with his primary care. He is unsure if it requires a PA or clarification. Patient will be going out of town and he does not have an inhaler and is worried.

## 2024-03-08 ENCOUNTER — Other Ambulatory Visit (HOSPITAL_COMMUNITY): Payer: Self-pay

## 2024-03-18 ENCOUNTER — Ambulatory Visit: Admitting: Psychology

## 2024-03-18 DIAGNOSIS — F4322 Adjustment disorder with anxiety: Secondary | ICD-10-CM | POA: Diagnosis not present
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
## 2024-03-21 NOTE — Telephone Encounter (Signed)
 Called pt appt set for next Monday.

## 2024-03-28 ENCOUNTER — Other Ambulatory Visit: Payer: Self-pay | Admitting: Family Medicine

## 2024-03-28 ENCOUNTER — Ambulatory Visit: Admitting: Family Medicine

## 2024-03-28 VITALS — BP 132/80 | HR 99 | Temp 98.0°F | Resp 16 | Ht 70.0 in | Wt 292.6 lb

## 2024-03-28 DIAGNOSIS — L989 Disorder of the skin and subcutaneous tissue, unspecified: Secondary | ICD-10-CM | POA: Diagnosis not present

## 2024-03-28 DIAGNOSIS — D489 Neoplasm of uncertain behavior, unspecified: Secondary | ICD-10-CM | POA: Diagnosis not present

## 2024-03-28 NOTE — Addendum Note (Signed)
 Addended by: Atalie Oros M on: 03/28/2024 07:49 AM   Modules accepted: Orders

## 2024-03-28 NOTE — Patient Instructions (Signed)
 Do not shower for the rest of the day. When you do wash it, use only soap and water. Do not vigorously scrub. Apply triple antibiotic ointment (like Neosporin) twice daily. Keep the area clean and dry.   Things to look out for: increasing pain not relieved by ibuprofen/acetaminophen, fevers, spreading redness, drainage of pus, or foul odor.  Give [place] 1 week to get the results of your biopsy back.  Let us know if you need anything.

## 2024-03-28 NOTE — Progress Notes (Signed)
 Chief Complaint  Patient presents with   Biopsy    Biopsy     Charles Collier is a 43 y.o. male here for a skin complaint.  Has a dark lesion on his groin area refractory to both steroids and antifungals.  It is dark.  Sometimes it itches.  No current pain or drainage.  Denies fevers.  No sick contacts.  This has been going on for 4 months now.  Past Medical History:  Diagnosis Date   Arthritis    Asthma    GERD (gastroesophageal reflux disease)    Heart murmur    Hyperlipidemia    Hypertension    OSA on CPAP     BP 132/80 (BP Location: Left Arm, Patient Position: Sitting)   Pulse 99   Temp 98 F (36.7 C) (Oral)   Resp 16   Ht 5' 10 (1.778 m)   Wt 292 lb 9.6 oz (132.7 kg)   SpO2 100%   BMI 41.98 kg/m  Gen: awake, alert, appearing stated age Lungs: No accessory muscle use Skin: Hyperpigmented patch over the proximal medial left thigh. No drainage, erythema, TTP, fluctuance, excoriation Psych: Age appropriate judgment and insight  Procedure note; punch biopsy  Informed consent was obtained. The area was cleaned with alcohol and then injected with 1 mL of 1% lidocaine with epinephrine . The area was then cleaned with Hibiclens. Vertical traction was placed along the skin and a 2 mm punch biopsy was used. The specimen was grasped with forceps and separated with iris scissors. The specimen was placed in a sterile specimen cup and sent to the lab. No sutures were placed. Cauterization was used to ensure hemostasis. Triple antibiotic ointment and a bandage was placed. There were no complications noted. The patient tolerated the procedure well.  Neoplasm of uncertain behavior  Patient with skin lesion refractory to initial therapies.  Looks like tinea cruris.  If on the penis, could consider urology referral.  Aftercare instructions verbalized and written down.  Biopsy results should come back next week.  Tylenol , ice, keep area clean and dry. F/u prn. The patient voiced  understanding and agreement to the plan.  Shellie Dials [place], DO 03/28/24 7:31 AM

## 2024-03-31 ENCOUNTER — Ambulatory Visit: Payer: Self-pay | Admitting: Family Medicine

## 2024-03-31 LAB — DERMATOLOGY PATHOLOGY

## 2024-04-01 ENCOUNTER — Other Ambulatory Visit: Payer: Self-pay

## 2024-04-01 DIAGNOSIS — N489 Disorder of penis, unspecified: Secondary | ICD-10-CM

## 2024-04-17 NOTE — Progress Notes (Signed)
 Chief Complaint: No chief complaint on file.   History of Present Illness:  Charles Collier is a 43 y.o. male who is seen in consultation from [name], DO for follow-up of tinea cruris as well as persistent irritation feeling of his penis.  The patient states that 2 to 3 months ago he started having a rash and irritation in his groin creases bilaterally.  This took quite some time to resolve, he used clotrimazole  and then clotrimazole  and betamethasone .  That was followed by a dose of Diflucan  more recently.  Currently, no current skin abnormalities other than a mild feeling of irritation on the tip of his penis.  No lower urinary tract symptoms.  No gross hematuria or dysuria.  He is married.  He has not had Noncon juggle sex.   Past Medical History:  Past Medical History:  Diagnosis Date   Arthritis    Asthma    GERD (gastroesophageal reflux disease)    Heart murmur    Hyperlipidemia    Hypertension    OSA on CPAP     Past Surgical History:  Past Surgical History:  Procedure Laterality Date   ANTERIOR CRUCIATE LIGAMENT REPAIR      Allergies:  Allergies  Allergen Reactions   Peanuts [Peanut Oil] Shortness Of Breath    Family History:  Family History  Problem Relation Age of Onset   Hypertension Mother    Hypertension Sister    Hypertension Maternal Grandmother    Hyperlipidemia Maternal Grandmother    Cancer Maternal Grandfather 67       prostate   Hearing loss Maternal Grandfather    Hypertension Maternal Grandfather    Hypertension Paternal Grandmother    Hypertension Paternal Grandfather    Colon cancer Neg Hx     Social History:  Social History   Tobacco Use   Smoking status: Former    Current packs/day: 0.00    Average packs/day: 0.5 packs/day for 16.0 years (8.0 ttl pk-yrs)    Types: Cigarettes    Start date: 02/10/2001    Quit date: 02/10/2017    Years since quitting: 7.1   Smokeless tobacco: Never  Vaping Use   Vaping status:  Former  Substance Use Topics   Alcohol use: Yes    Comment: occ   Drug use: No    Review of symptoms:  Constitutional:  Negative for unexplained weight loss, night sweats, fever, chills ENT:  Negative for nose bleeds, sinus pain, painful swallowing CV:  Negative for chest pain, shortness of breath, exercise intolerance, palpitations, loss of consciousness Resp:  Negative for cough, wheezing, shortness of breath GI:  Negative for nausea, vomiting, diarrhea, bloody stools GU:  Positives noted in HPI; otherwise negative for gross hematuria, dysuria, urinary incontinence Neuro:  Negative for seizures, poor balance, limb weakness, slurred speech Psych:  Negative for lack of energy, depression, anxiety Endocrine:  Negative for polydipsia, polyuria, symptoms of hypoglycemia (dizziness, hunger, sweating) Hematologic:  Negative for anemia, purpura, petechia, prolonged or excessive bleeding, use of anticoagulants  Allergic:  Negative for difficulty breathing or choking as a result of exposure to anything; no shellfish allergy ; no allergic response (rash/itch) to materials, foods  Physical exam: There were no vitals taken for this visit. GENERAL APPEARANCE:  Well appearing, well developed, well nourished, NAD HEENT: Atraumatic, Normocephalic. NECK: Normal appearance LUNGS: Normal inspiratory and expiratory excursion HEART: Regular Rate ABDOMEN: Obese.  There are no inguinal hernias GU: Phallus normal, no lesions. Scrotal skin normal. Testicles/epididymal structures normal.  Meatus normal.  EXTREMITIES: Moves all extremities well.  Without clubbing, cyanosis, or edema.  No erythema in either inguinal crease. NEUROLOGIC:  Alert and oriented x 3, normal gait, CN II-XII grossly intact.  MENTAL STATUS:  Appropriate. SKIN:  Warm, dry and intact.    Results:  I have reviewed referring/prior physicians notes  I have reviewed urinalysis--clear  I have reviewed dermatologic pathology results-Punch  biopsy of left thigh revealed no abnormality.  Assessment: History of tinea cruris, resolved   Plan: 1.  I reassured the patient about his exam  2.  He will return on an as-needed basis

## 2024-04-18 ENCOUNTER — Encounter: Payer: Self-pay | Admitting: Urology

## 2024-04-18 ENCOUNTER — Ambulatory Visit: Admitting: Urology

## 2024-04-18 VITALS — BP 148/93 | HR 60 | Ht 70.0 in | Wt 285.0 lb

## 2024-04-18 DIAGNOSIS — N4889 Other specified disorders of penis: Secondary | ICD-10-CM

## 2024-04-18 DIAGNOSIS — Z09 Encounter for follow-up examination after completed treatment for conditions other than malignant neoplasm: Secondary | ICD-10-CM

## 2024-04-18 DIAGNOSIS — Z8619 Personal history of other infectious and parasitic diseases: Secondary | ICD-10-CM | POA: Diagnosis not present

## 2024-04-18 LAB — URINALYSIS, ROUTINE W REFLEX MICROSCOPIC
Bilirubin, UA: NEGATIVE
Glucose, UA: NEGATIVE
Ketones, UA: NEGATIVE
Leukocytes,UA: NEGATIVE
Nitrite, UA: NEGATIVE
Protein,UA: NEGATIVE
Specific Gravity, UA: 1.015 (ref 1.005–1.030)
Urobilinogen, Ur: 0.2 mg/dL (ref 0.2–1.0)
pH, UA: 5.5 (ref 5.0–7.5)

## 2024-04-18 LAB — MICROSCOPIC EXAMINATION: Bacteria, UA: NONE SEEN

## 2024-04-29 ENCOUNTER — Ambulatory Visit: Admitting: Psychology

## 2024-05-06 ENCOUNTER — Ambulatory Visit: Admitting: Psychology

## 2024-05-11 ENCOUNTER — Other Ambulatory Visit: Payer: Self-pay | Admitting: Family Medicine

## 2024-05-11 DIAGNOSIS — J4540 Moderate persistent asthma, uncomplicated: Secondary | ICD-10-CM

## 2024-05-20 ENCOUNTER — Ambulatory Visit: Admitting: Psychology

## 2024-05-20 DIAGNOSIS — F4322 Adjustment disorder with anxiety: Secondary | ICD-10-CM | POA: Diagnosis not present

## 2024-05-20 NOTE — Progress Notes (Signed)
 [facility] Counselor/Therapist Progress Note  Patient ID: Charles Collier, MRN: [id number],    Date: 05/20/2024  Time Spent: 45 mins    start time: 1600    end time: 1645  Treatment Type: Individual Therapy  Reported Symptoms: Pt presents via Caregility video, granting consent for the session.  Pt shares he is in his home with no one else present; pt also shares that he understands the limits of virtual sessions.  I shared with pt that I am in my office with no one else here.  Mental Status Exam: Appearance:  Casual     Behavior: Appropriate  Motor: Normal  Speech/Language:  Clear and Coherent  Affect: Appropriate  Mood: normal  Thought process: normal  Thought content:   WNL  Sensory/Perceptual disturbances:   WNL  Orientation: oriented to person, place, and time/date  Attention: Good  Concentration: Good  Memory: WNL  Fund of knowledge:  Good  Insight:   Good  Judgment:  Good  Impulse Control: Good   Risk Assessment: Danger to Self:  No Self-injurious Behavior: No Danger to Others: No Duty to Warn:no Physical Aggression / Violence:No  Access to Firearms a concern: No  Gang Involvement:No   Subjective: Pt shares that one of his biggest stressors is wanting to find a different job that will allow him to be at home more with his family.  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  Pt has PTO through his employer.  His sleep is not great when he is out working in his truck.  He also uses a CPAP machine; he has had it for about 10 yrs.  Pt shares that he has not seen his PCP since our last session; he does have to see his PCP soon for a DOT physical due to his high blood pressure.  Pt tries to work out at [name] when he is out on the road; he also worked out this morning because he was at home.  Pt shares that he wants to work on his social anxiety; he believes that has held him back in his life.  He is quiet and withdrawn around others.  Pt shares that he used to  smoke cigarettes and he quit 3 yrs ago.  Pt shares that he is good with small groups of people that he knows well.  Pt grew up with his mom, step-dad, step-brother, and later his sister.  He did not do well in school but he did not get in trouble except for his grades.  Pt shares that he started feeling social anxiety around elementary school.  Talked with pt about talking with his PCP about getting back on Lexapro to help him deal with his social anxiety.  Encouraged pt to continue with his self care activities and we will meet in 3 wks for a follow up session.  Interventions: Cognitive Behavioral Therapy  Diagnosis:Adjustment disorder with anxious mood  Plan: Treatment Plan Strengths/Abilities:  Intelligent, Intuitive, Willing to participate in therapy Treatment Preferences:  Outpatient Individual Therapy Statement of Needs:  Patient is to use CBT, mindfulness and coping skills to help manage and/or decrease symptoms associated with their diagnosis. Symptoms:  Depressed/Irritable mood, worry, social withdrawal Problems Addressed:  Depressive thoughts, Sadness, Sleep issues, etc. Long Term Goals:  Pt to reduce overall level, frequency, and intensity of the feelings of anxiety as evidenced by decreased irritability, negative self talk, and helpless feelings from 6 to 7 days/week to 0 to 1 days/week, per client report, for  at least 3 consecutive months.  Progress: 30% Short Term Goals:  Pt to verbally express understanding of the relationship between feelings of anxiety and their impact on thinking patterns and behaviors.  Pt to verbalize an understanding of the role that distorted thinking plays in creating fears, excessive worry, and ruminations.  Progress: 30% Target Date:  05/20/2025 Frequency:  Bi-weekly Modality:  Cognitive Behavioral Therapy Interventions by Therapist:  Therapist will use CBT, Mindfulness exercises, Coping skills and Referrals, as needed by client. Client has verbally  approved this treatment plan.  Francis KATHEE Macintosh, [facility]

## 2024-05-30 ENCOUNTER — Ambulatory Visit (INDEPENDENT_AMBULATORY_CARE_PROVIDER_SITE_OTHER): Admitting: Family Medicine

## 2024-05-30 ENCOUNTER — Encounter: Payer: Self-pay | Admitting: Family Medicine

## 2024-05-30 VITALS — BP 136/80 | HR 68 | Temp 98.0°F | Resp 16 | Ht 70.0 in | Wt 294.6 lb

## 2024-05-30 DIAGNOSIS — J4540 Moderate persistent asthma, uncomplicated: Secondary | ICD-10-CM | POA: Diagnosis not present

## 2024-05-30 DIAGNOSIS — Z0000 Encounter for general adult medical examination without abnormal findings: Secondary | ICD-10-CM

## 2024-05-30 MED ORDER — MONTELUKAST SODIUM 10 MG PO TABS
ORAL_TABLET | ORAL | 2 refills | Status: AC
Start: 2024-05-30 — End: ?

## 2024-05-30 MED ORDER — AMLODIPINE BESYLATE 10 MG PO TABS: 10.0000 mg | ORAL_TABLET | Freq: Every day | ORAL | 2 refills | Status: AC

## 2024-05-30 MED ORDER — ALBUTEROL SULFATE 108 (90 BASE) MCG/ACT IN AEPB
INHALATION_SPRAY | RESPIRATORY_TRACT | 2 refills | Status: DC
Start: 2024-05-30 — End: 2024-06-15

## 2024-05-30 NOTE — Progress Notes (Signed)
 Chief Complaint  Patient presents with   Annual Exam    CPE    Well Male Charles Collier is here for a complete physical.   His last physical was >1 year ago.  Current diet: in general, a healthy diet.   Current exercise: lifting wts, walking Weight trend: stable Fatigue out of ordinary? Yes. Seat belt? Yes.   Advanced directive? No  Health maintenance Tetanus- Yes HIV- Yes Hep C- Yes  Past Medical History:  Diagnosis Date   Arthritis    Asthma    GERD (gastroesophageal reflux disease)    Heart murmur    Hyperlipidemia    Hypertension    OSA on CPAP      Past Surgical History:  Procedure Laterality Date   ANTERIOR CRUCIATE LIGAMENT REPAIR      Medications  Current Outpatient Medications on File Prior to Visit  Medication Sig Dispense Refill   albuterol  (PROVENTIL ) (2.5 MG/3ML) 0.083% nebulizer solution INHALE 3 ML BY NEBULIZATION EVERY 6 HOURS AS NEEDED FOR WHEEZING OR SHORTNESS OF BREATH 75 mL 2   Albuterol  Sulfate (PROAIR  RESPICLICK) 108 (90 Base) MCG/ACT AEPB Inhale 1-2 puffs every 6 hours as needed. 1 each 2   amLODipine  (NORVASC ) 10 MG tablet Take 1 tablet (10 mg total) by mouth daily. 90 tablet 2   budesonide -formoterol  (SYMBICORT ) 160-4.5 MCG/ACT inhaler INHALE 2 PUFFS BY MOUTH INTO THE LUNGS EVERY 12 HOURS 10.2 each 1   clotrimazole  (LOTRIMIN ) 1 % cream Apply 1 Application topically 2 (two) times daily. (Patient not taking: Reported on 04/18/2024) 60 g 0   clotrimazole -betamethasone  (LOTRISONE ) cream Apply 1 Application topically daily. (Patient not taking: Reported on 04/18/2024) 30 g 0   EPINEPHrine  0.3 mg/0.3 mL IJ SOAJ injection Inject 0.3 mLs (0.3 mg total) into the muscle as needed for anaphylaxis. 1 each 2   fluconazole  (DIFLUCAN ) 150 MG tablet Take 1 tab once per week for 4 doses. (Patient not taking: Reported on 04/18/2024) 4 tablet 0   fluticasone  (FLONASE ) 50 MCG/ACT nasal spray Place 2 sprays into both nostrils daily. 16 g 6   fluticasone  (FLOVENT  HFA)  110 MCG/ACT inhaler Inhale 2 puffs into the lungs daily. INHALE 2 PUFFS INTO THE LUNGS 2 TIMES DAILY WHEN IN YOUR YELLOW ZONE. RINSE MOUTH OUT AFTER USE. 12 each 1   montelukast  (SINGULAIR ) 10 MG tablet TAKE 1 TABLET BY MOUTH EVERYDAY AT BEDTIME 90 tablet 2   Allergies Allergies  Allergen Reactions   Peanuts [Peanut Oil] Shortness Of Breath    Family History Family History  Problem Relation Age of Onset   Hypertension Mother    Hypertension Sister    Hypertension Maternal Grandmother    Hyperlipidemia Maternal Grandmother    Cancer Maternal Grandfather 60       prostate   Hearing loss Maternal Grandfather    Hypertension Maternal Grandfather    Hypertension Paternal Grandmother    Hypertension Paternal Grandfather    Colon cancer Neg Hx     Review of Systems: Constitutional: no fevers or chills Eye:  no recent significant change in vision Ear/Nose/Mouth/Throat:  Ears:  no hearing loss Nose/Mouth/Throat:  no complaints of nasal congestion, no sore throat Cardiovascular:  no chest pain Respiratory:  no shortness of breath Gastrointestinal:  no abdominal pain, no change in bowel habits GU:  Male: negative for dysuria, frequency, and incontinence Musculoskeletal/Extremities:  no pain of the joints Integumentary (Skin/Breast):  no abnormal skin lesions reported Neurologic:  no headaches Endocrine: No unexpected weight changes Hematologic/Lymphatic:  no  night sweats  Exam BP 136/80 (BP Location: Left Arm, Patient Position: Sitting)   Pulse 68   Temp 98 F (36.7 C) (Oral)   Resp 16   Ht 5' 10 (1.778 m)   Wt 294 lb 9.6 oz (133.6 kg)   SpO2 98%   BMI 42.27 kg/m  General:  well developed, well nourished, in no apparent distress Skin:  no significant moles, warts, or growths Head:  no masses, lesions, or tenderness Eyes:  pupils equal and round, sclera anicteric without injection Ears:  canals without lesions, TMs shiny without retraction, no obvious effusion, no  erythema Nose:  nares patent, mucosa normal Throat/Pharynx:  lips and gingiva without lesion; tongue and uvula midline; non-inflamed pharynx; no exudates or postnasal drainage Neck: neck supple without adenopathy, thyromegaly, or masses Lungs:  clear to auscultation, breath sounds equal bilaterally, no respiratory distress Cardio:  regular rate and rhythm, no bruits, no LE edema Abdomen:  abdomen soft, nontender; bowel sounds normal; no masses or organomegaly Rectal: Deferred Musculoskeletal:  symmetrical muscle groups noted without atrophy or deformity Extremities:  no clubbing, cyanosis, or edema, no deformities, no skin discoloration Neuro:  gait normal; deep tendon reflexes normal and symmetric Psych: well oriented with normal range of affect and appropriate judgment/insight  Assessment and Plan  Well adult exam - Plan: CBC, Comprehensive metabolic panel with GFR, Lipid panel, Hepatitis B surface antibody,quantitative  Moderate persistent asthma without complication - Plan: Ambulatory referral to Allergy , Albuterol  Sulfate (PROAIR  RESPICLICK) 108 (90 Base) MCG/ACT AEPB, montelukast  (SINGULAIR ) 10 MG tablet   Well 43 y.o. male. Counseled on diet and exercise. Advanced directive form provided today.  Refer allergist given uncontrolled asthma.  Screen Hep B.  Other orders as above. Follow up in 6 mo pending the above workup. The patient voiced understanding and agreement to the plan.  Mabel Mt Juliette, DO 05/30/24 4:04 PM

## 2024-05-30 NOTE — Patient Instructions (Signed)
 If you do not hear anything about your referral in the next 1-2 weeks, call our office and ask for an update.  Give us  2-3 business days to get the results of your labs back.   Keep the diet clean and stay active.  Bump up the cardio and the intensity of your cardio.   Please get me a copy of your advanced directive form at your convenience.   Let us  know if you need anything.

## 2024-05-31 ENCOUNTER — Other Ambulatory Visit: Payer: Self-pay

## 2024-05-31 ENCOUNTER — Ambulatory Visit: Payer: Self-pay | Admitting: Family Medicine

## 2024-05-31 DIAGNOSIS — E878 Other disorders of electrolyte and fluid balance, not elsewhere classified: Secondary | ICD-10-CM

## 2024-05-31 LAB — COMPREHENSIVE METABOLIC PANEL WITH GFR
ALT: 23 U/L (ref 0–53)
AST: 33 U/L (ref 0–37)
Albumin: 4.3 g/dL (ref 3.5–5.2)
Alkaline Phosphatase: 68 U/L (ref 39–117)
BUN: 16 mg/dL (ref 6–23)
CO2: 22 meq/L (ref 19–32)
Calcium: 11.2 mg/dL — ABNORMAL HIGH (ref 8.4–10.5)
Chloride: 116 meq/L — ABNORMAL HIGH (ref 96–112)
Creatinine, Ser: 1.73 mg/dL — ABNORMAL HIGH (ref 0.40–1.50)
GFR: 47.73 mL/min — ABNORMAL LOW (ref >60.00)
Glucose, Bld: 79 mg/dL (ref 70–99)
Potassium: 5.5 meq/L — ABNORMAL HIGH (ref 3.5–5.1)
Sodium: 157 meq/L — ABNORMAL HIGH (ref 135–145)
Total Bilirubin: 0.7 mg/dL (ref 0.2–1.2)
Total Protein: 7 g/dL (ref 6.0–8.3)

## 2024-05-31 LAB — CBC
HCT: 46.1 % (ref 39.0–52.0)
Hemoglobin: 14.9 g/dL (ref 13.0–17.0)
MCHC: 32.4 g/dL (ref 30.0–36.0)
MCV: 85 fl (ref 78.0–100.0)
Platelets: 277 K/uL (ref 150.0–400.0)
RBC: 5.42 Mil/uL (ref 4.22–5.81)
RDW: 15 % (ref 11.5–15.5)
WBC: 8.1 K/uL (ref 4.0–10.5)

## 2024-05-31 LAB — LIPID PANEL
Cholesterol: 166 mg/dL (ref 0–200)
HDL: 45.6 mg/dL (ref >39.00)
LDL Cholesterol: 103 mg/dL — ABNORMAL HIGH (ref 0–99)
NonHDL: 120.07
Total CHOL/HDL Ratio: 4
Triglycerides: 86 mg/dL (ref 0.0–149.0)
VLDL: 17.2 mg/dL (ref 0.0–40.0)

## 2024-05-31 LAB — HEPATITIS B SURFACE ANTIBODY, QUANTITATIVE: Hep B S AB Quant (Post): 844 m[IU]/mL (ref >=10)

## 2024-06-01 ENCOUNTER — Other Ambulatory Visit

## 2024-06-01 DIAGNOSIS — E878 Other disorders of electrolyte and fluid balance, not elsewhere classified: Secondary | ICD-10-CM

## 2024-06-02 ENCOUNTER — Other Ambulatory Visit: Payer: Self-pay

## 2024-06-02 ENCOUNTER — Ambulatory Visit: Payer: Self-pay | Admitting: Family Medicine

## 2024-06-02 DIAGNOSIS — Z0000 Encounter for general adult medical examination without abnormal findings: Secondary | ICD-10-CM

## 2024-06-02 DIAGNOSIS — E878 Other disorders of electrolyte and fluid balance, not elsewhere classified: Secondary | ICD-10-CM

## 2024-06-02 LAB — PTH, INTACT AND CALCIUM
Calcium: 9.4 mg/dL (ref 8.6–10.3)
PTH: 29 pg/mL (ref 16–77)

## 2024-06-02 LAB — CALCIUM, IONIZED: Calcium, Ion: 5.1 mg/dL (ref 4.7–5.5)

## 2024-06-02 NOTE — Telephone Encounter (Signed)
 Copied from CRM [id number]. Topic: Appointments - Appointment Scheduling >> Jun 02, 2024 12:06 PM Viola F wrote: Patient returned Charles Collier call, scheduled lab appt for 06/06/24

## 2024-06-02 NOTE — Progress Notes (Addendum)
 Orders placed  Pt called and lvm to return call to schedule lab appt

## 2024-06-06 ENCOUNTER — Other Ambulatory Visit

## 2024-06-06 ENCOUNTER — Other Ambulatory Visit (INDEPENDENT_AMBULATORY_CARE_PROVIDER_SITE_OTHER)

## 2024-06-06 DIAGNOSIS — Z0000 Encounter for general adult medical examination without abnormal findings: Secondary | ICD-10-CM | POA: Diagnosis not present

## 2024-06-06 DIAGNOSIS — E878 Other disorders of electrolyte and fluid balance, not elsewhere classified: Secondary | ICD-10-CM | POA: Diagnosis not present

## 2024-06-06 LAB — HEMOGLOBIN A1C: Hgb A1c MFr Bld: 6.1 % (ref 4.6–6.5)

## 2024-06-06 LAB — BASIC METABOLIC PANEL WITH GFR
BUN: 16 mg/dL (ref 6–23)
CO2: 26 meq/L (ref 19–32)
Calcium: 8.8 mg/dL (ref 8.4–10.5)
Chloride: 102 meq/L (ref 96–112)
Creatinine, Ser: 1.45 mg/dL (ref 0.40–1.50)
GFR: 58.99 mL/min — ABNORMAL LOW (ref >60.00)
Glucose, Bld: 92 mg/dL (ref 70–99)
Potassium: 4.6 meq/L (ref 3.5–5.1)
Sodium: 137 meq/L (ref 135–145)

## 2024-06-07 ENCOUNTER — Ambulatory Visit: Payer: Self-pay | Admitting: Family Medicine

## 2024-06-10 ENCOUNTER — Ambulatory Visit: Admitting: Psychology

## 2024-06-10 DIAGNOSIS — F4322 Adjustment disorder with anxiety: Secondary | ICD-10-CM | POA: Diagnosis not present

## 2024-06-10 NOTE — Progress Notes (Signed)
 [facility] Counselor/Therapist Progress Note  Patient ID: Charles Collier, MRN: [id number],    Date: 06/10/2024  Time Spent: 45 mins    start time: 1600    end time: 1645  Treatment Type: Individual Therapy  Reported Symptoms: Pt presents via Caregility video, granting consent for the session.  Pt shares he is in his car with no one else present; pt also shares that he understands the limits of virtual sessions.  I shared with pt that I am in my office with no one else here.  Mental Status Exam: Appearance:  Casual     Behavior: Appropriate  Motor: Normal  Speech/Language:  Clear and Coherent  Affect: Appropriate  Mood: normal  Thought process: normal  Thought content:   WNL  Sensory/Perceptual disturbances:   WNL  Orientation: oriented to person, place, and time/date  Attention: Good  Concentration: Good  Memory: WNL  Fund of knowledge:  Good  Insight:   Good  Judgment:  Good  Impulse Control: Good   Risk Assessment: Danger to Self:  No Self-injurious Behavior: No Danger to Others: No Duty to Warn:no Physical Aggression / Violence:No  Access to Firearms a concern: No  Gang Involvement:No   Subjective: 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  Pt shares that he has been working out as frequently as he can.  He is also trying to learn about trading stocks and he enjoys what he has learned so far.  It is causing pt to check his emotions and try to make good choices with his trades.  [name] is a Engineer, materials at [name].  He also uses a CPAP machine; he has had it for about 10 yrs.  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  Pt tries to work out at [name] when he is out on the road.  Pt has a 54 yo son who spends weekends with pt and [name]; she has two daughters and three grandchildren, two of whom live with pt and  [name].  Pt shares that he has not had any additional incidents of social anxiety since our last session.  Pt shares that he has some anxiety related to being around white people.  He describes a situation he encountered when he was young and at the beach; his parents were talking with a white man on the beach while pt played with the man's son.  Pt was very uncomfortable in that situation and his parents told pt and his brother to avoid that boy the rest of their trip.  Pt and [name] are thinking about taking the grand kids to the fair this weekend.  Pt shares that he decided not to ask his PCP about getting back on medication for social anxiety.  He prefers to use techniques like deep breathing and meditations to help him relax more effectively.  Encouraged pt to continue with his self care activities and we will meet in 4 wks for a follow up session, due to my vacation.  Interventions: Cognitive Behavioral Therapy  Diagnosis:Adjustment disorder with anxious mood  Plan: Treatment Plan Strengths/Abilities:  Intelligent, Intuitive, Willing to participate in therapy Treatment Preferences:  Outpatient Individual Therapy Statement of Needs:  Patient is to use CBT, mindfulness and coping skills to help manage and/or decrease symptoms associated with their diagnosis. Symptoms:  Depressed/Irritable mood,  worry, social withdrawal Problems Addressed:  Depressive thoughts, Sadness, Sleep issues, etc. Long Term Goals:  Pt to reduce overall level, frequency, and intensity of the feelings of anxiety as evidenced by decreased irritability, negative self talk, and helpless feelings from 6 to 7 days/week to 0 to 1 days/week, per client report, for at least 3 consecutive months.  Progress: 30% Short Term Goals:  Pt to verbally express understanding of the relationship between feelings of anxiety and their impact on thinking patterns and behaviors.  Pt to verbalize an understanding of the role that distorted thinking  plays in creating fears, excessive worry, and ruminations.  Progress: 30% Target Date:  05/20/2025 Frequency:  Bi-weekly Modality:  Cognitive Behavioral Therapy Interventions by Therapist:  Therapist will use CBT, Mindfulness exercises, Coping skills and Referrals, as needed by client. Client has verbally approved this treatment plan.  Francis KATHEE Macintosh, [facility]

## 2024-06-11 ENCOUNTER — Encounter: Payer: Self-pay | Admitting: Family Medicine

## 2024-06-15 ENCOUNTER — Other Ambulatory Visit: Payer: Self-pay

## 2024-06-15 ENCOUNTER — Other Ambulatory Visit: Payer: Self-pay | Admitting: Family Medicine

## 2024-06-15 MED ORDER — ALBUTEROL SULFATE HFA 108 (90 BASE) MCG/ACT IN AERS: 1.0000 | INHALATION_SPRAY | Freq: Four times a day (QID) | RESPIRATORY_TRACT | 2 refills | Status: DC | PRN

## 2024-07-02 ENCOUNTER — Emergency Department (HOSPITAL_BASED_OUTPATIENT_CLINIC_OR_DEPARTMENT_OTHER): Admission: EM | Admit: 2024-07-02 | Discharge: 2024-07-02 | Disposition: A | Discharge status: Home / Self Care

## 2024-07-02 ENCOUNTER — Other Ambulatory Visit: Payer: Self-pay

## 2024-07-02 ENCOUNTER — Encounter (HOSPITAL_BASED_OUTPATIENT_CLINIC_OR_DEPARTMENT_OTHER): Payer: Self-pay

## 2024-07-02 ENCOUNTER — Emergency Department (HOSPITAL_BASED_OUTPATIENT_CLINIC_OR_DEPARTMENT_OTHER)

## 2024-07-02 DIAGNOSIS — R0602 Shortness of breath: Secondary | ICD-10-CM | POA: Diagnosis not present

## 2024-07-02 DIAGNOSIS — J069 Acute upper respiratory infection, unspecified: Secondary | ICD-10-CM | POA: Diagnosis not present

## 2024-07-02 DIAGNOSIS — Z79899 Other long term (current) drug therapy: Secondary | ICD-10-CM | POA: Insufficient documentation

## 2024-07-02 DIAGNOSIS — J45909 Unspecified asthma, uncomplicated: Secondary | ICD-10-CM | POA: Insufficient documentation

## 2024-07-02 DIAGNOSIS — R0989 Other specified symptoms and signs involving the circulatory and respiratory systems: Secondary | ICD-10-CM | POA: Diagnosis not present

## 2024-07-02 DIAGNOSIS — Z7951 Long term (current) use of inhaled steroids: Secondary | ICD-10-CM | POA: Insufficient documentation

## 2024-07-02 DIAGNOSIS — Z91010 Allergy to peanuts: Secondary | ICD-10-CM | POA: Diagnosis not present

## 2024-07-02 DIAGNOSIS — I10 Essential (primary) hypertension: Secondary | ICD-10-CM | POA: Insufficient documentation

## 2024-07-02 DIAGNOSIS — R059 Cough, unspecified: Secondary | ICD-10-CM | POA: Diagnosis not present

## 2024-07-02 LAB — RESP PANEL BY RT-PCR (RSV, FLU A&B, COVID)  RVPGX2
Influenza A by PCR: NEGATIVE
Influenza B by PCR: NEGATIVE
Resp Syncytial Virus by PCR: NEGATIVE
SARS Coronavirus 2 by RT PCR: NEGATIVE

## 2024-07-02 MED ORDER — ALBUTEROL SULFATE HFA 108 (90 BASE) MCG/ACT IN AERS: 2.0000 | INHALATION_SPRAY | RESPIRATORY_TRACT | 2 refills | Status: AC | PRN

## 2024-07-02 MED ORDER — PREDNISONE 10 MG (21) PO TBPK: ORAL_TABLET | Freq: Every day | ORAL | 0 refills | Status: AC

## 2024-07-02 NOTE — ED Triage Notes (Signed)
 Cough, congestion, fevers, R ear pain for 1 week. States grand daughters were sick last week.

## 2024-07-02 NOTE — ED Provider Notes (Signed)
 [facility] Provider Note   CSN: [id number] Arrival date & time: 07/02/24  9241     Patient presents with: Cough   Charles Collier is a 43 y.o. male.   43 year old male with past medical history of asthma and hypertension presenting to the emergency department today with cough, congestion, chest tightness, and subjective fevers and chills.  The patient states this been going now for the past 3 to 4 days.  He reports that he was around his grandchildren who were sick last week.  He came to the emergency department today due to ongoing symptoms.  Reports that his wife also has similar symptoms at home.  He is reporting some chest tightness and wheezing and has been using his albuterol .  He just used this prior to coming in today.  He came to the ER for further evaluation regarding this.   Cough      Prior to Admission medications   Medication Sig Start Date End Date Taking? Authorizing Provider  predniSONE  (STERAPRED UNI-PAK 21 TAB) 10 MG (21) TBPK tablet Take by mouth daily. Take 6 tabs by mouth daily  for 2 days, then 5 tabs for 2 days, then 4 tabs for 2 days, then 3 tabs for 2 days, 2 tabs for 2 days, then 1 tab by mouth daily for 2 days 07/02/24  Yes Ula Prentice SAUNDERS, MD  albuterol  (PROAIR  HFA) 108 (90 Base) MCG/ACT inhaler Inhale 2 puffs into the lungs every 4 (four) hours as needed for wheezing or shortness of breath. 07/02/24   Ula Prentice SAUNDERS, MD  albuterol  (PROVENTIL ) (2.5 MG/3ML) 0.083% nebulizer solution INHALE 3 ML BY NEBULIZATION EVERY 6 HOURS AS NEEDED FOR WHEEZING OR SHORTNESS OF BREATH 03/20/23   Wendling, Mabel Mt, DO  amLODipine  (NORVASC ) 10 MG tablet Take 1 tablet (10 mg total) by mouth daily. 05/30/24   [name], DO  budesonide -formoterol  (SYMBICORT ) 160-4.5 MCG/ACT inhaler INHALE 2 PUFFS BY MOUTH INTO THE LUNGS EVERY 12 HOURS 05/11/24   Wendling, Mabel Mt, DO  EPINEPHrine  0.3 mg/0.3 mL IJ SOAJ injection Inject  0.3 mLs (0.3 mg total) into the muscle as needed for anaphylaxis. 02/13/20   [name], DO  fluticasone  (FLONASE ) 50 MCG/ACT nasal spray Place 2 sprays into both nostrils daily. 12/29/22   [name], DO  fluticasone  (FLOVENT  HFA) 110 MCG/ACT inhaler Inhale 2 puffs into the lungs daily. INHALE 2 PUFFS INTO THE LUNGS 2 TIMES DAILY WHEN IN YOUR YELLOW ZONE. RINSE MOUTH OUT AFTER USE. 06/05/23   [name], DO  montelukast  (SINGULAIR ) 10 MG tablet TAKE 1 TABLET BY MOUTH EVERYDAY AT BEDTIME 05/30/24   Wendling, Mabel Mt, DO    Allergies: Peanuts [peanut oil]    Review of Systems  Respiratory:  Positive for cough.   All other systems reviewed and are negative.   Updated Vital Signs BP (!) 149/73   Pulse 64   Temp 98.3 F (36.8 C) (Oral)   Resp 18   SpO2 97%   Physical Exam Vitals and nursing note reviewed.   Gen: NAD Eyes: PERRL, EOMI HEENT: no oropharyngeal swelling Neck: trachea midline Resp: clear to auscultation bilaterally, diminished at lung bases Card: RRR, no murmurs, rubs, or gallops Abd: nontender, nondistended Extremities: no calf tenderness, no edema Vascular: 2+ radial pulses bilaterally, 2+ DP pulses bilaterally Skin: no rashes Psyc: acting appropriately   (all labs ordered are listed, but only abnormal results are displayed) Labs Reviewed  RESP PANEL  BY RT-PCR (RSV, FLU A&B, COVID)  [id number]    EKG: None  Radiology: DG Chest Portable 1 View Result Date: 07/02/2024 CLINICAL DATA:  Shortness of breath with cough and congestion. EXAM: PORTABLE CHEST 1 VIEW COMPARISON:  10/14/2022 FINDINGS: The lungs are clear without focal pneumonia, edema, pneumothorax or pleural effusion. The cardiopericardial silhouette is within normal limits for size. No acute bony abnormality. IMPRESSION: No active disease. Electronically Signed   By: Camellia Candle M.D.   On: 07/02/2024 09:09     Procedures   Medications Ordered in the ED - No  data to display                                  Medical Decision Making 43 year old male with past medical history of asthma, hypertension, and hyperlipidemia presenting to the emergency department today with chest tightness and congestion as well as cough.  I will further evaluate patient here with RSV/COVID/flu swab to evaluate for viral etiologies.  Will also obtain a chest x-ray to evaluate for pulmonary infiltrates or pneumothorax.  The patient is 97% on room air and does not have any wheezing after using his albuterol  prior to coming in.  If his workup is reassuring I think that he may be safely discharged.  Suspicion for ACS or pulmonary embolism is low at this time and his presentation is more consistent with infectious etiology.  The patient's chest x-ray and viral testing was negative.  He will be discharged with return precautions.  Amount and/or Complexity of Data Reviewed Radiology: ordered.  Risk Prescription drug management.        Final diagnoses:  Viral URI with cough    ED Discharge Orders          Ordered    albuterol  (PROAIR  HFA) 108 (90 Base) MCG/ACT inhaler  Every 4 hours PRN        07/02/24 0914    predniSONE  (STERAPRED UNI-PAK 21 TAB) 10 MG (21) TBPK tablet  Daily        07/02/24 0914               Ula Prentice SAUNDERS, MD 07/02/24 [phone]

## 2024-07-02 NOTE — Discharge Instructions (Signed)
 Your COVID and flu testing today were negative.  Your x-ray was clear.  Please start on the steroids and use 2 puffs of the albuterol  every 4-6 hours as needed for wheezing or shortness of breath.  Follow-up with your doctor and return to the ER for worsening symptoms.

## 2024-07-03 DIAGNOSIS — J45901 Unspecified asthma with (acute) exacerbation: Secondary | ICD-10-CM | POA: Diagnosis not present

## 2024-07-03 DIAGNOSIS — J189 Pneumonia, unspecified organism: Secondary | ICD-10-CM | POA: Diagnosis not present

## 2024-07-08 ENCOUNTER — Ambulatory Visit (INDEPENDENT_AMBULATORY_CARE_PROVIDER_SITE_OTHER): Admitting: Psychology

## 2024-07-08 DIAGNOSIS — F4322 Adjustment disorder with anxiety: Secondary | ICD-10-CM

## 2024-07-08 NOTE — Progress Notes (Signed)
 [facility] Counselor/Therapist Progress Note  Patient ID: Charles Collier, MRN: [id number],    Date: 07/08/2024  Time Spent: 45 mins    start time: 1600    end time: 1645  Treatment Type: Individual Therapy  Reported Symptoms: Pt presents via Caregility video, granting consent for the session.  Pt shares he is in his car with no one else present; pt also shares that he understands the limits of virtual sessions.  I shared with pt that I am in my office with no one else here.  Mental Status Exam: Appearance:  Casual     Behavior: Appropriate  Motor: Normal  Speech/Language:  Clear and Coherent  Affect: Appropriate  Mood: normal  Thought process: normal  Thought content:   WNL  Sensory/Perceptual disturbances:   WNL  Orientation: oriented to person, place, and time/date  Attention: Good  Concentration: Good  Memory: WNL  Fund of knowledge:  Good  Insight:   Good  Judgment:  Good  Impulse Control: Good   Risk Assessment: Danger to Self:  No Self-injurious Behavior: No Danger to Others: No Duty to Warn:no Physical Aggression / Violence:No  Access to Firearms a concern: No  Gang Involvement:No   Note: 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   Subjective: Pt shares that I am not doing bad.  My mind is focused on what I want to do next for my job; I am ready to do something different and get out of this truck.  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  Pt shares that they got in debt earlier in their life and they are trying to keep chipping away on their debt.  Talked with pt about Consumer Credit Counseling through [employer].  Pt shares that [name] continues to work full time at [employer].  Pt shares that they continue to raise two of their grandchildren as well as his 23 yo son.  Pt shares that  [name] has two daughters (21 yo and 3 yo); the 28 yo lives with them and her 58 yo daughter has the two daughters and an infant child; she is just not a fit mom.  The youngest baby is with his dad who lives with Chandra's oldest daughter.  Pt shares that he had to go to the doctor last week because of an upper respiratory infection and he had to be on prednisone  and antibiotics.  He has an appointment in October with an asthma specialist.  Pt continues to use his CPAP machine.  Pt also shares that he is an emotional eater and he and [name] believe that he may have SAD.  Encouraged pt to continue with his self care activities and we will meet in 2 wks for a follow up session.  Interventions: Cognitive Behavioral Therapy  Diagnosis:Adjustment disorder with anxious mood  Plan: Treatment Plan Strengths/Abilities:  Intelligent, Intuitive, Willing to participate in therapy Treatment Preferences:  Outpatient Individual Therapy Statement of Needs:  Patient is to use CBT, mindfulness and coping skills to help manage and/or decrease symptoms associated with their diagnosis. Symptoms:  Depressed/Irritable mood, worry, social withdrawal Problems Addressed:  Depressive thoughts, Sadness, Sleep issues, etc. Long Term Goals:  Pt to reduce overall level, frequency, and intensity of the feelings of anxiety as evidenced by decreased  irritability, negative self talk, and helpless feelings from 6 to 7 days/week to 0 to 1 days/week, per client report, for at least 3 consecutive months.  Progress: 30% Short Term Goals:  Pt to verbally express understanding of the relationship between feelings of anxiety and their impact on thinking patterns and behaviors.  Pt to verbalize an understanding of the role that distorted thinking plays in creating fears, excessive worry, and ruminations.  Progress: 30% Target Date:  05/20/2025 Frequency:  Bi-weekly Modality:  Cognitive Behavioral Therapy Interventions by Therapist:   Therapist will use CBT, Mindfulness exercises, Coping skills and Referrals, as needed by client. Client has verbally approved this treatment plan.  Francis KATHEE Macintosh, [facility]

## 2024-07-19 DIAGNOSIS — J029 Acute pharyngitis, unspecified: Secondary | ICD-10-CM | POA: Diagnosis not present

## 2024-07-19 DIAGNOSIS — J069 Acute upper respiratory infection, unspecified: Secondary | ICD-10-CM | POA: Diagnosis not present

## 2024-07-22 ENCOUNTER — Ambulatory Visit: Admitting: Psychology

## 2024-08-12 ENCOUNTER — Ambulatory Visit: Admitting: Psychology

## 2024-08-12 DIAGNOSIS — F4322 Adjustment disorder with anxiety: Secondary | ICD-10-CM | POA: Diagnosis not present
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
## 2024-08-26 ENCOUNTER — Ambulatory Visit: Payer: Self-pay | Admitting: Psychology

## 2024-08-26 ENCOUNTER — Ambulatory Visit: Payer: Self-pay | Admitting: Internal Medicine

## 2024-08-30 ENCOUNTER — Emergency Department (HOSPITAL_BASED_OUTPATIENT_CLINIC_OR_DEPARTMENT_OTHER)
Admission: EM | Admit: 2024-08-30 | Discharge: 2024-08-30 | Disposition: A | Discharge status: Home / Self Care | Payer: Self-pay | Attending: Emergency Medicine | Admitting: Emergency Medicine

## 2024-08-30 ENCOUNTER — Emergency Department (HOSPITAL_BASED_OUTPATIENT_CLINIC_OR_DEPARTMENT_OTHER): Payer: Self-pay

## 2024-08-30 ENCOUNTER — Encounter (HOSPITAL_BASED_OUTPATIENT_CLINIC_OR_DEPARTMENT_OTHER): Payer: Self-pay | Admitting: Emergency Medicine

## 2024-08-30 DIAGNOSIS — J45909 Unspecified asthma, uncomplicated: Secondary | ICD-10-CM | POA: Insufficient documentation

## 2024-08-30 DIAGNOSIS — Z91010 Allergy to peanuts: Secondary | ICD-10-CM | POA: Insufficient documentation

## 2024-08-30 DIAGNOSIS — M25562 Pain in left knee: Secondary | ICD-10-CM | POA: Diagnosis not present

## 2024-08-30 DIAGNOSIS — Z7951 Long term (current) use of inhaled steroids: Secondary | ICD-10-CM | POA: Insufficient documentation

## 2024-08-30 NOTE — Discharge Instructions (Signed)
 You were seen for your knee pain in the emergency department.  You were found to have some bone spurs.  At home, please continue Tylenol  and ibuprofen.  Elevate your knee and ice it to limit the swelling and pain.    Check your MyChart online for the results of any tests that had not resulted by the time you left the emergency department.   Follow-up with orthopedics as soon as possible.  You may also go to the [facility] urgent care   Return immediately to the emergency department if you experience any of the following: Worsening pain, swelling, fevers, or any other concerning symptoms.    Thank you for visiting our Emergency Department. It was a pleasure taking care of you today.

## 2024-08-30 NOTE — ED Provider Notes (Signed)
  EMERGENCY DEPARTMENT AT [facility] Provider Note   CSN: [id number] Arrival date & time: 08/30/24  9445     Patient presents with: Knee Pain   Charles Collier is a 43 y.o. male.   Patient is a 43 year old male with history of asthma and prior ACL repair to the right knee.  Patient presenting today with complaints of left knee pain.  This began last night while he was lying in bed.  He describes pain to the upper lateral aspect of the left patella.  Pain worsened through the night when he attempted to ambulate.  He denies any specific injury or trauma.         Prior to Admission medications   Medication Sig Start Date End Date Taking? Authorizing Provider  albuterol  (PROAIR  HFA) 108 (90 Base) MCG/ACT inhaler Inhale 2 puffs into the lungs every 4 (four) hours as needed for wheezing or shortness of breath. 07/02/24   Ula Prentice SAUNDERS, MD  albuterol  (PROVENTIL ) (2.5 MG/3ML) 0.083% nebulizer solution INHALE 3 ML BY NEBULIZATION EVERY 6 HOURS AS NEEDED FOR WHEEZING OR SHORTNESS OF BREATH 03/20/23   Wendling, Mabel Mt, DO  amLODipine  (NORVASC ) 10 MG tablet Take 1 tablet (10 mg total) by mouth daily. 05/30/24   [name], DO  budesonide -formoterol  (SYMBICORT ) 160-4.5 MCG/ACT inhaler INHALE 2 PUFFS BY MOUTH INTO THE LUNGS EVERY 12 HOURS 05/11/24   Wendling, Mabel Mt, DO  EPINEPHrine  0.3 mg/0.3 mL IJ SOAJ injection Inject 0.3 mLs (0.3 mg total) into the muscle as needed for anaphylaxis. 02/13/20   [name], DO  fluticasone  (FLONASE ) 50 MCG/ACT nasal spray Place 2 sprays into both nostrils daily. 12/29/22   [name], DO  fluticasone  (FLOVENT  HFA) 110 MCG/ACT inhaler Inhale 2 puffs into the lungs daily. INHALE 2 PUFFS INTO THE LUNGS 2 TIMES DAILY WHEN IN YOUR YELLOW ZONE. RINSE MOUTH OUT AFTER USE. 06/05/23   [name], DO  montelukast  (SINGULAIR ) 10 MG tablet TAKE 1 TABLET BY MOUTH EVERYDAY AT BEDTIME 05/30/24   Wendling,  Mabel Mt, DO  predniSONE  (STERAPRED UNI-PAK 21 TAB) 10 MG (21) TBPK tablet Take by mouth daily. Take 6 tabs by mouth daily  for 2 days, then 5 tabs for 2 days, then 4 tabs for 2 days, then 3 tabs for 2 days, 2 tabs for 2 days, then 1 tab by mouth daily for 2 days 07/02/24   Ula Prentice SAUNDERS, MD    Allergies: Peanuts [peanut oil]    Review of Systems  All other systems reviewed and are negative.   Updated Vital Signs BP 138/88 (BP Location: Right Arm)   Pulse (!) 56   Temp 97.8 F (36.6 C) (Oral)   Resp (!) 22   Ht 5' 10 (1.778 m)   Wt 136.1 kg   SpO2 100%   BMI 43.05 kg/m   Physical Exam Vitals and nursing note reviewed.  Constitutional:      Appearance: Normal appearance.  Pulmonary:     Effort: Pulmonary effort is normal.  Musculoskeletal:     Comments: The left knee is grossly normal in appearance.  There is no palpable effusion.  There is tenderness to palpation over the lateral aspect of the left patella, but no palpable abnormality.  He has good range of motion with no crepitus.  Anterior and posterior drawer tests are negative and there is no significant varus or valgus laxity.  Skin:    General: Skin is warm and dry.  Neurological:  Mental Status: He is alert and oriented to person, place, and time.     (all labs ordered are listed, but only abnormal results are displayed) Labs Reviewed - No data to display  EKG: None  Radiology: No results found.   Procedures   Medications Ordered in the ED - No data to display  Clinical Course as of 08/30/24 2304  Tue Aug 30, 2024  9348 Assumed care from Dr Geroldine. 43 yo M hx of R acl repair. with no relevant PMH who presents with L patellar pain. No known injuries.  [RP]  0759 DG Knee Complete 4 Views Left IMPRESSION: Mild lateral compartment degenerative spurring. No acute findings. [RP]  0801 Patient reexamined.  Does have some tenderness palpation of the lateral aspect of his patella.  No knee effusion.  Full  range of motion.  Suspect the bone spurs are causing his pain.  Will have him follow-up with orthopedics. [RP]    Clinical Course User Index [RP] Yolande Lamar BROCKS, MD                                 Medical Decision Making Amount and/or Complexity of Data Reviewed Radiology: ordered. Decision-making details documented in ED Course.   Patient presenting with complaints of left knee pain.  X-rays are currently pending.  I have reviewed the studies and see no obvious acute abnormality.  Anticipate discharged with NSAIDs and rest.  Care signed out to oncoming provider awaiting results of the imaging studies.     Final diagnoses:  None    ED Discharge Orders     None          Geroldine Berg, MD 08/30/24 2304

## 2024-08-30 NOTE — ED Notes (Signed)
 Patient transported to X-ray

## 2024-08-30 NOTE — ED Provider Notes (Signed)
  Physical Exam  BP 138/88 (BP Location: Right Arm)   Pulse (!) 56   Temp 97.8 F (36.6 C) (Oral)   Resp (!) 22   Ht 5' 10 (1.778 m)   Wt 136.1 kg   SpO2 100%   BMI 43.05 kg/m   Physical Exam  Procedures  Procedures  ED Course / MDM   Clinical Course as of 08/30/24 0802  Tue Aug 30, 2024  0651 Assumed care from Dr Geroldine. 43 yo M hx of R acl repair. with no relevant PMH who presents with L patellar pain. No known injuries.  [RP]  0759 DG Knee Complete 4 Views Left IMPRESSION: Mild lateral compartment degenerative spurring. No acute findings. [RP]  0801 Patient reexamined.  Does have some tenderness palpation of the lateral aspect of his patella.  No knee effusion.  Full range of motion.  Suspect the bone spurs are causing his pain.  Will have him follow-up with orthopedics. [RP]    Clinical Course User Index [RP] Yolande Lamar BROCKS, MD   Medical Decision Making Amount and/or Complexity of Data Reviewed Radiology: ordered. Decision-making details documented in ED Course.      Yolande Lamar BROCKS, MD 08/30/24 [phone]

## 2024-08-30 NOTE — ED Triage Notes (Signed)
 Left knee pain, started last night in bed. Denies injury. 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

## 2024-10-12 ENCOUNTER — Encounter: Payer: Self-pay | Admitting: Family Medicine

## 2024-10-13 ENCOUNTER — Emergency Department (HOSPITAL_BASED_OUTPATIENT_CLINIC_OR_DEPARTMENT_OTHER)
Admission: EM | Admit: 2024-10-13 | Discharge: 2024-10-13 | Disposition: A | Discharge status: Home / Self Care | Payer: Self-pay | Source: Home / Self Care

## 2024-10-13 ENCOUNTER — Other Ambulatory Visit: Payer: Self-pay

## 2024-10-13 ENCOUNTER — Encounter (HOSPITAL_BASED_OUTPATIENT_CLINIC_OR_DEPARTMENT_OTHER): Payer: Self-pay

## 2024-10-13 DIAGNOSIS — H10023 Other mucopurulent conjunctivitis, bilateral: Secondary | ICD-10-CM | POA: Insufficient documentation

## 2024-10-13 DIAGNOSIS — Z91010 Allergy to peanuts: Secondary | ICD-10-CM | POA: Insufficient documentation

## 2024-10-13 MED ORDER — POLYMYXIN B-TRIMETHOPRIM 10000-0.1 UNIT/ML-% OP SOLN: 1.0000 [drp] | OPHTHALMIC | 0 refills | Status: DC

## 2024-10-13 MED ORDER — POLYMYXIN B-TRIMETHOPRIM 10000-0.1 UNIT/ML-% OP SOLN: 1.0000 [drp] | OPHTHALMIC | 0 refills | Status: AC

## 2024-10-13 NOTE — ED Notes (Signed)
 Pt requesting different pharmacy, as his regular one is closed today. MD notified and pharmacy preference updated.

## 2024-10-13 NOTE — ED Triage Notes (Signed)
 Presents to ED with c/o R eye redness and drainage for three days. States now going into L eye. Denies any foreign body sensation or injury

## 2024-10-13 NOTE — ED Provider Notes (Signed)
 " Charles Collier EMERGENCY DEPARTMENT AT [facility] Provider Note   CSN: [id number] Arrival date & time: 10/13/24  0848     Patient presents with: Eye Problem   Charles Collier is a 44 y.o. male.   44 year old presented with pinkeye.  Primarily to his right eye has been having increasing injection in drainage.  He started off somewhat clear, but now yellowish/greenish and having some redness and irritation to his left eye.  No vision changes, no proptosis.  No painful EOM.  No headaches.  Feels his normal self other than his eyes.   Eye Problem      Prior to Admission medications  Medication Sig Start Date End Date Taking? Authorizing Provider  albuterol  (PROAIR  HFA) 108 (90 Base) MCG/ACT inhaler Inhale 2 puffs into the lungs every 4 (four) hours as needed for wheezing or shortness of breath. 07/02/24   Ula Prentice SAUNDERS, MD  albuterol  (PROVENTIL ) (2.5 MG/3ML) 0.083% nebulizer solution INHALE 3 ML BY NEBULIZATION EVERY 6 HOURS AS NEEDED FOR WHEEZING OR SHORTNESS OF BREATH 03/20/23   Wendling, Mabel Mt, DO  amLODipine  (NORVASC ) 10 MG tablet Take 1 tablet (10 mg total) by mouth daily. 05/30/24   [name], DO  budesonide -formoterol  (SYMBICORT ) 160-4.5 MCG/ACT inhaler INHALE 2 PUFFS BY MOUTH INTO THE LUNGS EVERY 12 HOURS 05/11/24   Wendling, Mabel Mt, DO  EPINEPHrine  0.3 mg/0.3 mL IJ SOAJ injection Inject 0.3 mLs (0.3 mg total) into the muscle as needed for anaphylaxis. 02/13/20   [name], DO  fluticasone  (FLONASE ) 50 MCG/ACT nasal spray Place 2 sprays into both nostrils daily. 12/29/22   [name], DO  fluticasone  (FLOVENT  HFA) 110 MCG/ACT inhaler Inhale 2 puffs into the lungs daily. INHALE 2 PUFFS INTO THE LUNGS 2 TIMES DAILY WHEN IN YOUR YELLOW ZONE. RINSE MOUTH OUT AFTER USE. 06/05/23   [name], DO  montelukast  (SINGULAIR ) 10 MG tablet TAKE 1 TABLET BY MOUTH EVERYDAY AT BEDTIME 05/30/24   Wendling, Mabel Mt, DO   predniSONE  (STERAPRED UNI-PAK 21 TAB) 10 MG (21) TBPK tablet Take by mouth daily. Take 6 tabs by mouth daily  for 2 days, then 5 tabs for 2 days, then 4 tabs for 2 days, then 3 tabs for 2 days, 2 tabs for 2 days, then 1 tab by mouth daily for 2 days 07/02/24   Ula Prentice SAUNDERS, MD    Allergies: Peanuts [peanut oil]    Review of Systems  Updated Vital Signs BP 132/88 (BP Location: Right Arm)   Pulse 96   Temp 97.9 F (36.6 C) (Oral)   Resp 17   Ht 5' 10 (1.778 m)   Wt (!) 137.9 kg   SpO2 96%   BMI 43.62 kg/m   Physical Exam Vitals reviewed.  HENT:     Head: Normocephalic and atraumatic.     Nose: Nose normal.     Mouth/Throat:     Mouth: Mucous membranes are moist.  Eyes:     General: No scleral icterus.       Right eye: Discharge present.     Extraocular Movements: Extraocular movements intact.     Pupils: Pupils are equal, round, and reactive to light.     Comments: Conjunctival injection bilaterally, but worse on right.  No obvious foreign bodies.  No apical and Pribula or defect.  No APD.  Grossly normal visual acuity  Cardiovascular:     Rate and Rhythm: Normal rate.  Pulmonary:     Effort: Pulmonary  effort is normal.  Musculoskeletal:     Cervical back: Normal range of motion.  Neurological:     Mental Status: He is oriented to person, place, and time.  Psychiatric:        Mood and Affect: Mood normal.        Behavior: Behavior normal.     (all labs ordered are listed, but only abnormal results are displayed) Labs Reviewed - No data to display  EKG: None  Radiology: No results found.   Procedures   Medications Ordered in the ED - No data to display                                  Medical Decision Making 44 year old male presenting emergency department with pinkeye x 3 days.  Vital signs reassuring.  Overall well-appearing.  Will give antibiotic drops.  Follow-up with PCP.  Return precautions given.       Final diagnoses:  None    ED  Discharge Orders     None          Neysa Caron PARAS, DO 10/13/24 9085  "

## 2024-10-13 NOTE — Discharge Instructions (Signed)
 Take the eyedrops until symptoms resolve.  Follow-up with your primary doctor.  Return for fevers, chills, severe eye pain, vision loss, redness to the eyelids, eyes begin to bulge, pain moving your eyes around, or any new or worsening symptoms that are concerning to you.
